# Patient Record
Sex: Female | Born: 1940 | Race: White | Hispanic: No | Smoking: Never smoker
Health system: Southern US, Community
[De-identification: ages and names within clinical notes are randomized; demographics above are authoritative.]

## PROBLEM LIST (undated history)

## (undated) DIAGNOSIS — E039 Hypothyroidism, unspecified: Secondary | ICD-10-CM

## (undated) DIAGNOSIS — M329 Systemic lupus erythematosus, unspecified: Secondary | ICD-10-CM

## (undated) DIAGNOSIS — F0390 Unspecified dementia without behavioral disturbance: Secondary | ICD-10-CM

## (undated) DIAGNOSIS — I720 Aneurysm of carotid artery: Secondary | ICD-10-CM

---

## 2009-10-04 ENCOUNTER — Ambulatory Visit: Payer: Self-pay | Admitting: Surgery

## 2009-11-08 ENCOUNTER — Ambulatory Visit: Payer: Self-pay | Admitting: Surgery

## 2009-11-08 ENCOUNTER — Encounter: Admission: RE | Admit: 2009-11-08 | Discharge: 2009-11-08 | Payer: Self-pay | Admitting: Surgery

## 2011-02-14 NOTE — Procedures (Signed)
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   HISTORY:  Diabetes:  No.  Cardiac:  No.  Hypertension:  No.  Smoking:  No.  Previous Surgery:  No.  CV History:  Asymptomatic, the patient states she has known about a  pulsatile area in her left neck area for greater than 20 years.  Amaurosis Fugax No, Paresthesias No, Hemiparesis No                                       RIGHT             LEFT  Brachial systolic pressure:         142               132  Brachial Doppler waveforms:         Normal            Normal  Vertebral direction of flow:        Antegrade         Antegrade  DUPLEX VELOCITIES (cm/sec)  CCA peak systolic                   93                87  ECA peak systolic                   84                70  ICA peak systolic                   65                100  ICA end diastolic                   18                20  PLAQUE MORPHOLOGY:  PLAQUE AMOUNT:                      None              None  PLAQUE LOCATION:   IMPRESSION:  1. No evidence of stenosis noted in the bilateral internal carotid      arteries.  2. There is a 1.6 x 1.7 x 1.5 cm structure with turbulent internal      arterial flow noted superior to the right bifurcation and adjacent      to the proximal to mid right internal carotid artery.  The source      of the flow in this area is not adequately visualized.   ___________________________________________  V. Charlena Cross, MD   CH/MEDQ  D:  10/05/2009  T:  10/06/2009  Job:  [id number]

## 2011-02-14 NOTE — Assessment & Plan Note (Signed)
OFFICE VISIT   LATAUSHA, FLAMM  DOB:  Feb 13, 1941                                       11/08/2009  [id number]#:[id number]   Patient comes back today for follow-up, of having ordered a CT scan for  a left neck mass.  She comes back in for further discussion.  She did  tell me today that she has had a lupus-like diagnosis in the past.  She  has not had any symptoms since I last saw her.   DIAGNOSTIC STUDIES:  CT scan was extensively reviewed.  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   I spent an excess of 45 minutes with the patient and her son, describing  her condition as well as showing it to them on imaging studies.  We  discussed that this is an extremely rare finding.  The etiology is  somewhat unclear.  It could be related to fibromuscular disease or some  form of autoimmune disease, given her history of thyroid disorder as  well as possible lupus.  Regardless, based on the size of the aneurysms,  I think it would be prudent to proceed with resection and repair.  The  next step in this management would be to get an arteriogram to better  define the anatomy.  She has been scheduled for a bilateral internal  carotid angiogram on Tuesday, February 22nd.  Following her angiogram,  we will have a preoperative consultation to talk about the details of  the operation.     Jorge Ny, MD  Electronically Signed   VWB/MEDQ  D:  11/08/2009  T:  11/09/2009  Job:  [id number]   cc:   Clydie Braun L. Hal Hope, M.D.
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
## 2011-02-14 NOTE — Procedures (Signed)
VASCULAR LAB EXAM   INDICATION:   HISTORY:  Diabetes.  No cardiac.  No hypertension.   EXAM:  Evaluation of left neck mass.   IMPRESSION:  Questionable aneurysm over branch artery from the left  internal carotid artery and calcified mass noted posterior to the  internal carotid artery and external carotid artery at the bifurcation  area.   REASON:  Left neck mass.   ___________________________________________  V. Charlena Cross, MD   CB/MEDQ  D:  11/08/2009  T:  11/08/2009  Job:  [id number]

## 2016-05-20 DIAGNOSIS — E039 Hypothyroidism, unspecified: Secondary | ICD-10-CM | POA: Insufficient documentation

## 2016-09-27 ENCOUNTER — Emergency Department (HOSPITAL_COMMUNITY): Payer: Medicare HMO

## 2016-09-27 ENCOUNTER — Emergency Department (HOSPITAL_COMMUNITY)
Admission: EM | Admit: 2016-09-27 | Discharge: 2016-09-27 | Disposition: A | Discharge status: Home / Self Care | Payer: Medicare HMO | Attending: Emergency Medicine | Admitting: Emergency Medicine

## 2016-09-27 ENCOUNTER — Encounter (HOSPITAL_COMMUNITY): Payer: Self-pay | Admitting: Emergency Medicine

## 2016-09-27 DIAGNOSIS — Y999 Unspecified external cause status: Secondary | ICD-10-CM | POA: Insufficient documentation

## 2016-09-27 DIAGNOSIS — S0990XA Unspecified injury of head, initial encounter: Secondary | ICD-10-CM | POA: Diagnosis not present

## 2016-09-27 DIAGNOSIS — Y92002 Bathroom of unspecified non-institutional (private) residence single-family (private) house as the place of occurrence of the external cause: Secondary | ICD-10-CM | POA: Insufficient documentation

## 2016-09-27 DIAGNOSIS — Y939 Activity, unspecified: Secondary | ICD-10-CM | POA: Diagnosis not present

## 2016-09-27 DIAGNOSIS — S42291A Other displaced fracture of upper end of right humerus, initial encounter for closed fracture: Secondary | ICD-10-CM | POA: Diagnosis not present

## 2016-09-27 DIAGNOSIS — W19XXXA Unspecified fall, initial encounter: Secondary | ICD-10-CM

## 2016-09-27 DIAGNOSIS — W228XXA Striking against or struck by other objects, initial encounter: Secondary | ICD-10-CM | POA: Insufficient documentation

## 2016-09-27 DIAGNOSIS — S0511XA Contusion of eyeball and orbital tissues, right eye, initial encounter: Secondary | ICD-10-CM | POA: Diagnosis not present

## 2016-09-27 LAB — BASIC METABOLIC PANEL
ANION GAP: 9 (ref 5–15)
BUN: 15 mg/dL (ref 6–20)
CALCIUM: 9.1 mg/dL (ref 8.9–10.3)
CO2: 27 mmol/L (ref 22–32)
Chloride: 104 mmol/L (ref 101–111)
Creatinine, Ser: 0.87 mg/dL (ref 0.44–1.00)
GFR calc Af Amer: >60 mL/min (ref >60)
GFR calc non Af Amer: >60 mL/min (ref >60)
GLUCOSE: 118 mg/dL — AB (ref 65–99)
Potassium: 4.1 mmol/L (ref 3.5–5.1)
Sodium: 140 mmol/L (ref 135–145)

## 2016-09-27 LAB — CBC WITH DIFFERENTIAL/PLATELET
Basophils Absolute: 0 10*3/uL (ref 0.0–0.1)
Basophils Relative: 0 %
Eosinophils Absolute: 0.1 10*3/uL (ref 0.0–0.7)
Eosinophils Relative: 1 %
HEMATOCRIT: 40.3 % (ref 36.0–46.0)
Hemoglobin: 13.2 g/dL (ref 12.0–15.0)
LYMPHS PCT: 12 %
Lymphs Abs: 0.8 10*3/uL (ref 0.7–4.0)
MCH: 29.8 pg (ref 26.0–34.0)
MCHC: 32.8 g/dL (ref 30.0–36.0)
MCV: 91 fL (ref 78.0–100.0)
MONO ABS: 0.5 10*3/uL (ref 0.1–1.0)
MONOS PCT: 8 %
NEUTROS ABS: 5.6 10*3/uL (ref 1.7–7.7)
Neutrophils Relative %: 79 %
Platelets: 242 10*3/uL (ref 150–400)
RBC: 4.43 MIL/uL (ref 3.87–5.11)
RDW: 13.4 % (ref 11.5–15.5)
WBC: 7.1 10*3/uL (ref 4.0–10.5)

## 2016-09-27 MED ORDER — HYDROCODONE-ACETAMINOPHEN 5-325 MG PO TABS: 1.0000 | ORAL_TABLET | Freq: Four times a day (QID) | ORAL | 0 refills | Status: DC | PRN

## 2016-09-27 MED ORDER — HYDROCODONE-ACETAMINOPHEN 5-325 MG PO TABS
2.0000 | ORAL_TABLET | Freq: Once | ORAL | Status: AC
  Administered 2016-09-27: 2 via ORAL
  Filled 2016-09-27: qty 2

## 2016-09-27 NOTE — ED Provider Notes (Signed)
[facility]-EMERGENCY DEPT Provider Note   CSN: [id number] Arrival date & time: 09/27/16  [id number]     History   Chief Complaint Chief Complaint  Patient presents with  . Fall    HPI Tamara Fisher is a 75 y.o. female.  Patient presents to the ED from [facility] assisted [facility] with a chief complaint of fall.  The fall was unwitnessed.  Patient states that over the past several days, she has felt dizzy.  She states that last night she got up to use the bathroom, and did so without difficulty. She states that after she stood up to get off of the toilet, she lost her balance and fell forward striking her head and shoulder on the bath tub. She reports moderate pain to the right shoulder and forehead.  She denies any LOC.  She denies any neck pain, numbness, weakness, or tingling.  She denies any chest pain, SOB, or abdominal pain.  She reports that her shoulder pain is worsened with palpation of the distal arm and with movement.   The history is provided by the patient. No language interpreter was used.    Past Medical History:  Diagnosis Date  . Thyroid disease     There are no active problems to display for this patient.   History reviewed. No pertinent surgical history.  OB History    No data available       Home Medications    Prior to Admission medications   Not on File    Family History No family history on file.  Social History Social History  Substance Use Topics  . Smoking status: Never Smoker  . Smokeless tobacco: Never Used  . Alcohol use No     Allergies   Patient has no allergy information on record.   Review of Systems Review of Systems  All other systems reviewed and are negative.    Physical Exam Updated Vital Signs BP 154/80 (BP Location: Left Arm)   Pulse 68   Temp 98.3 F (36.8 C) (Oral)   Resp 18   Ht 5\' 6"  (1.676 m)   Wt 68 kg   SpO2 95%   BMI 24.21 kg/m   Physical Exam  Constitutional: She is oriented to person, place, and  time. She appears well-developed and well-nourished.  HENT:  Head: Normocephalic and atraumatic.  Contusion/hematoma to right upper orbit No crepitus No entrapment  Eyes: Conjunctivae and EOM are normal. Pupils are equal, round, and reactive to light.  Neck: Normal range of motion. Neck supple.  C-spine nontender to palpation, no step-off, deformity, or other abnormality  Cardiovascular: Normal rate and regular rhythm.  Exam reveals no gallop and no friction rub.   No murmur heard. Pulmonary/Chest: Effort normal and breath sounds normal. No respiratory distress. She has no wheezes. She has no rales. She exhibits no tenderness.  Abdominal: Soft. Bowel sounds are normal. She exhibits no distension and no mass. There is no tenderness. There is no rebound and no guarding.  Musculoskeletal: Normal range of motion. She exhibits no edema or tenderness.  Right shoulder tender to palpation ROM and strength limited by pain  Neurological: She is alert and oriented to person, place, and time.  Skin: Skin is warm and dry.  Psychiatric: She has a normal mood and affect. Her behavior is normal. Judgment and thought content normal.  Nursing note and vitals reviewed.    ED Treatments / Results  Labs (all labs ordered are listed, but only abnormal results are displayed)  Labs Reviewed  BASIC METABOLIC PANEL - Abnormal; Notable for the following:       Result Value   Glucose, Bld 118 (*)    All other components within normal limits  CBC WITH DIFFERENTIAL/PLATELET    EKG  EKG Interpretation None       Radiology Dg Shoulder Right  Result Date: 09/27/2016 CLINICAL DATA:  Fall. EXAM: RIGHT SHOULDER - 2+ VIEW COMPARISON:  No recent prior. FINDINGS: Acromioclavicular and glenohumeral degenerative change. Subtle minimally displaced subcapital right humeral fracture appears to be present. No other focal abnormality identified. IMPRESSION: Subtle subcapital minimally displaced right humeral fracture  appears to be present. Electronically Signed   By: Maisie Fus  Register   On: 09/27/2016 06:47   Dg Wrist Complete Right  Result Date: 09/27/2016 CLINICAL DATA:  Fall. EXAM: RIGHT WRIST - COMPLETE 3+ VIEW COMPARISON:  No recent prior. FINDINGS: Diffuse osteopenia and degenerative change. No definite fracture identified. No evidence of dislocation. IMPRESSION: Diffuse osteopenia degenerative change. No definite fracture identified. Electronically Signed   By: Maisie Fus  Register   On: 09/27/2016 06:50   Ct Head Wo Contrast  Result Date: 09/27/2016 CLINICAL DATA:  Unwitnessed fall from toilet. No loss of consciousness. Hematoma about right eye. EXAM: CT HEAD WITHOUT CONTRAST CT CERVICAL SPINE WITHOUT CONTRAST TECHNIQUE: Multidetector CT imaging of the head and cervical spine was performed following the standard protocol without intravenous contrast. Multiplanar CT image reconstructions of the cervical spine were also generated. COMPARISON:  Neck CTA 11/08/2009 FINDINGS: CT HEAD FINDINGS Brain: No evidence of acute infarction, hemorrhage, hydrocephalus, extra-axial collection or mass lesion/mass effect. Mild chronic small vessel ischemia, normal for age. Vascular: Atherosclerosis of skullbase vasculature without hyperdense vessel or abnormal calcification. Skull: Right frontal scalp hematoma. No skull fracture. No focal lesion. Sinuses/Orbits: Paranasal sinuses and mastoid air cells are clear. The visualized orbits are unremarkable. Other: None. CT CERVICAL SPINE FINDINGS Alignment: Normal. Skull base and vertebrae: No acute fracture. No primary bone lesion or focal pathologic process. Soft tissues and spinal canal: No prevertebral fluid or swelling. No visible canal hematoma. Disc levels: Disc space narrowing and endplate spurring is most prominent at C5-C6. Upper chest: No acute abnormality. Other: Partially calcified right internal carotid artery aneurysm is unchanged. Calcifications previously seen about a left  internal carotid artery aneurysm are not visualized, likely surgically resected. IMPRESSION: 1. Right supraorbital scalp hematoma. No fracture or acute intracranial abnormality. 2. No acute fracture or subluxation of the cervical spine. Electronically Signed   By: Rubye Oaks M.D.   On: 09/27/2016 06:46   Ct Cervical Spine Wo Contrast  Result Date: 09/27/2016 CLINICAL DATA:  Unwitnessed fall from toilet. No loss of consciousness. Hematoma about right eye. EXAM: CT HEAD WITHOUT CONTRAST CT CERVICAL SPINE WITHOUT CONTRAST TECHNIQUE: Multidetector CT imaging of the head and cervical spine was performed following the standard protocol without intravenous contrast. Multiplanar CT image reconstructions of the cervical spine were also generated. COMPARISON:  Neck CTA 11/08/2009 FINDINGS: CT HEAD FINDINGS Brain: No evidence of acute infarction, hemorrhage, hydrocephalus, extra-axial collection or mass lesion/mass effect. Mild chronic small vessel ischemia, normal for age. Vascular: Atherosclerosis of skullbase vasculature without hyperdense vessel or abnormal calcification. Skull: Right frontal scalp hematoma. No skull fracture. No focal lesion. Sinuses/Orbits: Paranasal sinuses and mastoid air cells are clear. The visualized orbits are unremarkable. Other: None. CT CERVICAL SPINE FINDINGS Alignment: Normal. Skull base and vertebrae: No acute fracture. No primary bone lesion or focal pathologic process. Soft tissues and spinal canal: No prevertebral fluid  or swelling. No visible canal hematoma. Disc levels: Disc space narrowing and endplate spurring is most prominent at C5-C6. Upper chest: No acute abnormality. Other: Partially calcified right internal carotid artery aneurysm is unchanged. Calcifications previously seen about a left internal carotid artery aneurysm are not visualized, likely surgically resected. IMPRESSION: 1. Right supraorbital scalp hematoma. No fracture or acute intracranial abnormality. 2. No  acute fracture or subluxation of the cervical spine. Electronically Signed   By: Rubye OaksMelanie  Ehinger M.D.   On: 09/27/2016 06:46    Procedures Procedures (including critical care time)  Medications Ordered in ED Medications - No data to display   Initial Impression / Assessment and Plan / ED Course  I have reviewed the triage vital signs and the nursing notes.  Pertinent labs & imaging results that were available during my care of the patient were reviewed by me and considered in my medical decision making (see chart for details).  Clinical Course     Patient with fall from toilet.  Seems to have been mechanical (fell forward immediately after standing up), could be orthostatic.  Will get imaging.  Will check CBC to rule out anemia and BMP/EKG to look for any rhythm or electrolyte abnormalities.  Patient is overall well appearing.  Imaging is as above.  Right proximal mildly displaced humerus fracture.  Neurovascularly intact.  Will treat with sling immobilization.  Recommend orthopedic follow-up.  Fall seems to have been mechanical.  No LOC.  Patient stays at assisted living.  Will DC back to assisted living center.  Final Clinical Impressions(s) / ED Diagnoses   Final diagnoses:  Fall, initial encounter  Other closed nondisplaced fracture of proximal end of right humerus, initial encounter    New Prescriptions New Prescriptions   HYDROCODONE-ACETAMINOPHEN (NORCO/VICODIN) 5-325 MG TABLET    Take 1 tablet by mouth every 6 (six) hours as needed.     Roxy Horsemanobert Sarahanne Novakowski, PA-C 09/27/16 [phone]    Lavera Guiseana Duo Liu, MD 09/27/16 1754

## 2016-09-27 NOTE — Progress Notes (Signed)
Orthopedic Tech Progress Note Patient Details:  Tamara Fisher Apr 30, 1941 [id number]  Ortho Devices Type of Ortho Device: Sling immobilizer Ortho Device/Splint Location: rue Ortho Device/Splint Interventions: Application   Juston Goheen 09/27/2016, 8:15 AM

## 2016-09-27 NOTE — ED Triage Notes (Signed)
Per EMS pt from [facility] unwitnessed fall off toilet, pt became dizzy and fell forward hit R arm and R forehead, denies LOC, pt been having dizzy spells recently. Hematoma to R eye, no blood thinners.

## 2016-09-27 NOTE — Discharge Instructions (Signed)
Please keep your arm in the splint.  Please follow-up with the orthopedic doctor listed below.

## 2017-04-05 DIAGNOSIS — I509 Heart failure, unspecified: Secondary | ICD-10-CM | POA: Insufficient documentation

## 2017-08-21 ENCOUNTER — Observation Stay (HOSPITAL_COMMUNITY)
Admission: EM | Admit: 2017-08-21 | Discharge: 2017-08-22 | Payer: Medicare HMO | Attending: Internal Medicine | Admitting: Internal Medicine

## 2017-08-21 ENCOUNTER — Emergency Department (HOSPITAL_COMMUNITY): Payer: Medicare HMO

## 2017-08-21 ENCOUNTER — Encounter (HOSPITAL_COMMUNITY): Payer: Self-pay

## 2017-08-21 ENCOUNTER — Other Ambulatory Visit: Payer: Self-pay

## 2017-08-21 DIAGNOSIS — W19XXXA Unspecified fall, initial encounter: Secondary | ICD-10-CM | POA: Diagnosis present

## 2017-08-21 DIAGNOSIS — F0390 Unspecified dementia without behavioral disturbance: Secondary | ICD-10-CM | POA: Diagnosis not present

## 2017-08-21 DIAGNOSIS — I6523 Occlusion and stenosis of bilateral carotid arteries: Secondary | ICD-10-CM | POA: Diagnosis not present

## 2017-08-21 DIAGNOSIS — J9811 Atelectasis: Secondary | ICD-10-CM

## 2017-08-21 DIAGNOSIS — M4854XA Collapsed vertebra, not elsewhere classified, thoracic region, initial encounter for fracture: Secondary | ICD-10-CM | POA: Insufficient documentation

## 2017-08-21 DIAGNOSIS — I444 Left anterior fascicular block: Secondary | ICD-10-CM | POA: Diagnosis not present

## 2017-08-21 DIAGNOSIS — M4050 Lordosis, unspecified, site unspecified: Secondary | ICD-10-CM | POA: Insufficient documentation

## 2017-08-21 DIAGNOSIS — Z79899 Other long term (current) drug therapy: Secondary | ICD-10-CM | POA: Insufficient documentation

## 2017-08-21 DIAGNOSIS — M25461 Effusion, right knee: Secondary | ICD-10-CM | POA: Insufficient documentation

## 2017-08-21 DIAGNOSIS — I700 Atherosclerosis of aorta: Secondary | ICD-10-CM | POA: Diagnosis not present

## 2017-08-21 DIAGNOSIS — I4581 Long QT syndrome: Secondary | ICD-10-CM | POA: Diagnosis not present

## 2017-08-21 DIAGNOSIS — Y92129 Unspecified place in nursing home as the place of occurrence of the external cause: Secondary | ICD-10-CM | POA: Insufficient documentation

## 2017-08-21 DIAGNOSIS — M4040 Postural lordosis, site unspecified: Secondary | ICD-10-CM | POA: Insufficient documentation

## 2017-08-21 DIAGNOSIS — M50322 Other cervical disc degeneration at C5-C6 level: Secondary | ICD-10-CM | POA: Insufficient documentation

## 2017-08-21 DIAGNOSIS — R0902 Hypoxemia: Secondary | ICD-10-CM

## 2017-08-21 DIAGNOSIS — J9601 Acute respiratory failure with hypoxia: Secondary | ICD-10-CM | POA: Diagnosis not present

## 2017-08-21 DIAGNOSIS — M5136 Other intervertebral disc degeneration, lumbar region: Secondary | ICD-10-CM | POA: Insufficient documentation

## 2017-08-21 DIAGNOSIS — E039 Hypothyroidism, unspecified: Secondary | ICD-10-CM | POA: Diagnosis not present

## 2017-08-21 DIAGNOSIS — E8809 Other disorders of plasma-protein metabolism, not elsewhere classified: Secondary | ICD-10-CM | POA: Diagnosis present

## 2017-08-21 DIAGNOSIS — I779 Disorder of arteries and arterioles, unspecified: Secondary | ICD-10-CM

## 2017-08-21 DIAGNOSIS — I739 Peripheral vascular disease, unspecified: Secondary | ICD-10-CM

## 2017-08-21 DIAGNOSIS — N3000 Acute cystitis without hematuria: Secondary | ICD-10-CM | POA: Diagnosis not present

## 2017-08-21 HISTORY — DX: Unspecified dementia, unspecified severity, without behavioral disturbance, psychotic disturbance, mood disturbance, and anxiety: F03.90

## 2017-08-21 HISTORY — DX: Hypothyroidism, unspecified: E03.9

## 2017-08-21 NOTE — ED Provider Notes (Signed)
[facility]-EMERGENCY DEPT Provider Note   CSN: [id number] Arrival date & time: 08/21/17  2129     History   Chief Complaint Chief Complaint  Patient presents with  . Fall    HPI Tamara Fisher is a 76 y.o. female with a history of dementia who presents to the emergency department from memory care at [facility] after an unwitnessed fall earlier tonight.  Staff reports they heard a loud crash and immediately responded.  When they arrived in the patient's room, they found the patient laying in the bathtub. Staff reports no loss of consciousness when they arrived by her side, within several seconds.  In the emergency department, she  has no medical complaints.  The patient's daughter-in-law reports that the patient has gained approximately 25 pounds in the last 4 months.  She reports that she was scheduled for an echo cardiogram and a stress test with cardiology, but both of the appointment got rescheduled.   Level 5 caveat secondary to dementia.  The history is provided by the patient. No language interpreter was used.    Past Medical History:  Diagnosis Date  . Thyroid disease     There are no active problems to display for this patient.   History reviewed. No pertinent surgical history.  OB History    No data available       Home Medications    Prior to Admission medications   Medication Sig Start Date End Date Taking? Authorizing Provider  acetaminophen (TYLENOL) 500 MG tablet Take 500 mg by mouth 2 (two) times daily. MAY ALSO TAKE AN EXTRA TABLET DAILY FOR BREAKTHROUGH PAIN   Yes [provider]  ASPERCREME W/LIDOCAINE EX Apply 1 application topically 2 (two) times daily. Apply to bilateral knees   Yes [provider]  cholecalciferol (VITAMIN D) 1000 units tablet Take 1,000 Units by mouth daily.   Yes [provider]  clotrimazole (LOTRIMIN) 1 % cream Apply 1 application topically 2 (two) times daily as needed  (yeast). Apply to breasts   Yes [provider]  clotrimazole-betamethasone (LOTRISONE) cream Apply 1 application topically 2 (two) times daily as needed (rash).   Yes [provider]  divalproex (DEPAKOTE SPRINKLE) 125 MG capsule Take 250 mg by mouth 2 (two) times daily.   Yes [provider]  Foot Care Products (CORN CUSHIONS) PADS 1 application as directed. Apply corn pad to left second toe at all times when wearing shoes   Yes [provider]  levothyroxine (SYNTHROID, LEVOTHROID) 50 MCG tablet Take 50 mcg by mouth daily before breakfast.   Yes [provider]  LORazepam (ATIVAN) 0.5 MG tablet Take 0.5 mg by mouth 2 (two) times daily as needed for anxiety.   Yes [provider]  Melatonin 3 MG TABS Take 6 mg by mouth at bedtime.   Yes [provider]  nystatin (MYCOSTATIN/NYSTOP) powder Apply 1 g topically 3 (three) times daily as needed (rash).   Yes [provider]  OLANZapine (ZYPREXA) 5 MG tablet Take 5 mg by mouth 2 (two) times daily.   Yes [provider]  ondansetron (ZOFRAN) 4 MG tablet Take 4 mg by mouth every 4 (four) hours as needed for nausea or vomiting.   Yes [provider]  sertraline (ZOLOFT) 50 MG tablet Take 75 mg by mouth daily.   Yes [provider]  Skin Protectants, Misc. (EUCERIN) cream Apply 1 application topically daily. Apply to both feet   Yes [provider]  Family History History reviewed. No pertinent family history.  Social History Social History   Tobacco Use  . Smoking status: Never Smoker  . Smokeless tobacco: Never Used  Substance Use Topics  . Alcohol use: No  . Drug use: No     Allergies   Patient has no known allergies.   Review of Systems Review of Systems  Unable to perform ROS: Dementia  Constitutional: Negative for activity change.  Respiratory: Negative for shortness of breath.   Cardiovascular: Negative for chest pain.    Gastrointestinal: Negative for abdominal pain.  Genitourinary: Negative for dysuria and frequency.  Musculoskeletal: Negative for back pain.  Skin: Negative for rash and wound.  Neurological: Negative for dizziness.   Physical Exam Updated Vital Signs BP 119/66   Pulse 79   Temp 98.2 F (36.8 C) (Oral)   Resp 13   SpO2 92%   Physical Exam  Constitutional: No distress.  Elderly female who is pleasantly demented.  HENT:  Head: Normocephalic and atraumatic. Head is without raccoon's eyes.  No scalp hematoma.  Eyes: Conjunctivae are normal.  Neck: Neck supple.  Cardiovascular: Normal rate and regular rhythm. Exam reveals no gallop and no friction rub.  No murmur heard. Pulmonary/Chest: Effort normal and breath sounds normal. No stridor. No respiratory distress. She has no wheezes. She has no rales. She exhibits no tenderness.  Abdominal: Soft. Bowel sounds are normal. She exhibits no distension and no mass. There is no tenderness. There is no rebound and no guarding. A hernia is present.  Protuberant abdomen.  Likely periumbilical hernia noted on exam.  Musculoskeletal: Normal range of motion. She exhibits tenderness. She exhibits no edema or deformity.  No tenderness to palpation of the cervical, thoracic, or lumbar spine or surrounding paraspinal muscles.  Mild tenderness to palpation of the right knee.  No overlying erythema, ecchymosis, edema, or warmth.  Mild tenderness to palpation over the left shoulder; the patient reports a previous left humerus fracture from several years ago.   The remainder of the patient's bilateral joints, upper and lower extremities are nontender to palpation.  Neurological: She is alert.  Cranial nerves 2-12 grossly intact. Finger-to-nose is normal. 5/5 motor strength of the bilateral upper and lower extremities. Moves all four extremities. NVI.  Ambulatory with a walker.   Skin: Skin is warm. Capillary refill takes less than 2 seconds. No rash  noted.  No superficial abrasions, skin tears, or ecchymosis noted on exam.  Psychiatric: Her behavior is normal.  Nursing note and vitals reviewed.    ED Treatments / Results  Labs (all labs ordered are listed, but only abnormal results are displayed) Labs Reviewed  CBC  BRAIN NATRIURETIC PEPTIDE  COMPREHENSIVE METABOLIC PANEL  D-DIMER, QUANTITATIVE (NOT AT [facility])  I-STAT TROPONIN, ED    EKG  EKG Interpretation None       Radiology Dg Chest 2 View  Result Date: 08/21/2017 CLINICAL DATA:  Unwitnessed fall. Sternal pain on examination. Patient reports no specific complaints. EXAM: CHEST  2 VIEW COMPARISON:  None. FINDINGS: Shallow inspiration with linear atelectasis in the lung bases. Normal heart size and pulmonary vascularity. No focal airspace disease or consolidation in the lungs. No blunting of costophrenic angles. No pneumothorax. Mediastinal contours appear intact. Calcification and torsion of the aorta. Degenerative changes in the spine. Sternum does not appear depressed. IMPRESSION: Shallow inspiration with linear atelectasis in the lung bases. Electronically Signed   By: Burman Nieves M.D.   On: 08/21/2017 23:42   Dg Thoracic  Spine 2 View  Result Date: 08/21/2017 CLINICAL DATA:  Unwitnessed fall. EXAM: THORACIC SPINE 2 VIEWS COMPARISON:  None. FINDINGS: Anterior compression of the T9 vertebra. No prior comparison studies are available but there are degenerative changes at the endplates, suggesting that this is an old fracture. Vertebral body heights are otherwise preserved. Normal alignment of the thoracic spine. Diffuse degenerative changes throughout with narrowed interspaces and endplate hypertrophic changes. No paraspinal soft tissue swelling. No focal bone lesion or bone destruction. IMPRESSION: Old appearing compression fracture of T9. Diffuse degenerative changes in the thoracic spine. Normal alignment. No acute displaced fractures identified. Electronically Signed    By: Burman NievesWilliam  Stevens M.D.   On: 08/21/2017 23:44   Dg Lumbar Spine Complete  Result Date: 08/21/2017 CLINICAL DATA:  Unwitnessed fall. EXAM: LUMBAR SPINE - COMPLETE 4+ VIEW COMPARISON:  None. FINDINGS: Mild endplate compression deformities at L1-2 and L2-3 levels. This is likely due to osteoporosis. Normal alignment of the lumbar spine. Degenerative changes throughout with narrowed interspaces and endplate hypertrophic changes. No focal bone lesion or bone destruction. Bone cortex appears intact. Vascular calcifications. IMPRESSION: Normal alignment of the lumbar spine. Ballooning of endplates likely representing osteoporotic changes. No acute displaced fractures are identified. Electronically Signed   By: Burman NievesWilliam  Stevens M.D.   On: 08/21/2017 23:45   Ct Head Wo Contrast  Result Date: 08/22/2017 CLINICAL DATA:  Unwitnessed fall at nursing home. EXAM: CT HEAD WITHOUT CONTRAST CT CERVICAL SPINE WITHOUT CONTRAST TECHNIQUE: Multidetector CT imaging of the head and cervical spine was performed following the standard protocol without intravenous contrast. Multiplanar CT image reconstructions of the cervical spine were also generated. COMPARISON:  09/27/2016 FINDINGS: CT HEAD FINDINGS Brain: Chronic small vessel ischemic disease. Chronic basal ganglial lacunar infarcts. No large vascular territory infarction, hemorrhage or midline shift. No hydrocephalus. No intra-axial mass nor extra-axial fluid collections. Vascular: No hyperdense vessels. Moderate atherosclerosis of the carotid siphons. Skull: Negative for fracture. Sinuses/Orbits: Mild ethmoid sinus mucosal thickening. Intact orbits and globes. Other: Clear bilateral mastoids. CT CERVICAL SPINE FINDINGS Alignment: Maintained cervical lordosis. Intact craniocervical relationship and atlantodental interval. Skull base and vertebrae: No acute fracture. No primary bone lesion or focal pathologic process. Probable left vertebral ectasia accounting for prominence  of the left vertebral foramina at multiple levels. Soft tissues and spinal canal: No prevertebral soft tissue swelling. No visible canal hematoma. Disc levels: Degenerative disc disease and endplate spurring Z6-1C5-6 and C6-7. No jumped or perched facets. No central or significant neural foraminal encroachment Upper chest: No acute abnormality at either lung apex. Minimal aortic atherosclerosis. Other: Moderate extracranial carotid arteriosclerosis. IMPRESSION: 1. Chronic small vessel ischemic disease. Chronic bilateral basal ganglial lacunar infarcts. No acute intracranial abnormality. 2. Degenerative disc disease C5-6 and C6-7. No acute cervical spine fracture or posttraumatic subluxation. Electronically Signed   By: Tollie Ethavid  Kwon M.D.   On: 08/22/2017 00:04   Ct Cervical Spine Wo Contrast  Result Date: 08/22/2017 CLINICAL DATA:  Unwitnessed fall at nursing home. EXAM: CT HEAD WITHOUT CONTRAST CT CERVICAL SPINE WITHOUT CONTRAST TECHNIQUE: Multidetector CT imaging of the head and cervical spine was performed following the standard protocol without intravenous contrast. Multiplanar CT image reconstructions of the cervical spine were also generated. COMPARISON:  09/27/2016 FINDINGS: CT HEAD FINDINGS Brain: Chronic small vessel ischemic disease. Chronic basal ganglial lacunar infarcts. No large vascular territory infarction, hemorrhage or midline shift. No hydrocephalus. No intra-axial mass nor extra-axial fluid collections. Vascular: No hyperdense vessels. Moderate atherosclerosis of the carotid siphons. Skull: Negative for fracture. Sinuses/Orbits:  Mild ethmoid sinus mucosal thickening. Intact orbits and globes. Other: Clear bilateral mastoids. CT CERVICAL SPINE FINDINGS Alignment: Maintained cervical lordosis. Intact craniocervical relationship and atlantodental interval. Skull base and vertebrae: No acute fracture. No primary bone lesion or focal pathologic process. Probable left vertebral ectasia accounting for  prominence of the left vertebral foramina at multiple levels. Soft tissues and spinal canal: No prevertebral soft tissue swelling. No visible canal hematoma. Disc levels: Degenerative disc disease and endplate spurring W1-1 and C6-7. No jumped or perched facets. No central or significant neural foraminal encroachment Upper chest: No acute abnormality at either lung apex. Minimal aortic atherosclerosis. Other: Moderate extracranial carotid arteriosclerosis. IMPRESSION: 1. Chronic small vessel ischemic disease. Chronic bilateral basal ganglial lacunar infarcts. No acute intracranial abnormality. 2. Degenerative disc disease C5-6 and C6-7. No acute cervical spine fracture or posttraumatic subluxation. Electronically Signed   By: Tollie Eth M.D.   On: 08/22/2017 00:04   Dg Knee Complete 4 Views Right  Result Date: 08/21/2017 CLINICAL DATA:  Unwitnessed fall EXAM: RIGHT KNEE - COMPLETE 4+ VIEW COMPARISON:  None. FINDINGS: There is a small suprapatellar joint effusion. Tricompartmental osteoarthritis is noted without definite fracture nor joint dislocation. Slight shallow depression of the lateral tibial plateau is likely due to osteoarthritis given bone-on-bone appearance on one of the views. IMPRESSION: 1. Tricompartmental osteoarthritis with small joint effusion. 2. No acute fracture nor joint dislocation is apparent. Electronically Signed   By: Tollie Eth M.D.   On: 08/21/2017 23:48   Dg Hips Bilat W Or Wo Pelvis 5 Views  Result Date: 08/21/2017 CLINICAL DATA:  Unwitnessed fall in bathroom. EXAM: DG HIP (WITH OR WITHOUT PELVIS) 5+V BILAT COMPARISON:  None. FINDINGS: There is no evidence of hip fracture or dislocation. Mild degenerative joint space narrowing of both hips left slightly worse than right. Mild degenerative disc disease of the included lower lumbar spine with facet arthropathy. The bony pelvis appears intact. IMPRESSION: 1. Lower lumbar degenerative disc and facet arthropathy. 2. Mild  degenerative joint space narrowing both hips. 3. No acute osseous abnormality. Electronically Signed   By: Tollie Eth M.D.   On: 08/21/2017 23:46    Procedures Procedures (including critical care time)  Medications Ordered in ED Medications - No data to display   Initial Impression / Assessment and Plan / ED Course  I have reviewed the triage vital signs and the nursing notes.  Pertinent labs & imaging results that were available during my care of the patient were reviewed by me and considered in my medical decision making (see chart for details).     76 year old female presenting from [facility] after an unwitnessed fall.  Upon arrival in the room, the patient's SaO2 is 89% on room air with good waveform on the monitor.  Titrated O2 to 2 L with SaO2 of 93-94.  CT of the head and cervical spine are unremarkable.  X-rays of the cervical, thoracic, lumbar spine and pelvis with bilateral hips, and right knee are unremarkable for acute changes.  On second evaluation, the patient was monitored for 2-3 minutes with oxygen saturation maintained at 92% on RA.  When the patient was ambulated through the department, SaO2 remained between 86-89%. The patient was placed back on 2L of O2. EKG pending. CBC, CMP, D-dimer, and BNP are pending.  The patient was discussed and evaluated with Dr. Elesa Massed, attending physician.  Consulted the hospitalist team and spoke with Dr. Robb Matar who will admit the patient for hypoxia. The patient appears reasonably  stabilized for admission considering the current resources, flow, and capabilities available in the ED at this time, and I doubt any other [facility] requiring further screening and/or treatment in the ED prior to admission.  Final Clinical Impressions(s) / ED Diagnoses   Final diagnoses:  Hypoxia  Fall, initial encounter    ED Discharge Orders    None       Barkley Boards, PA-C 08/22/17 0207    Ward, Layla Maw, DO 08/22/17 [phone]

## 2017-08-21 NOTE — ED Notes (Signed)
Bed: WA07 Expected date:  Expected time:  Means of arrival:  Comments: Fall 

## 2017-08-21 NOTE — ED Triage Notes (Addendum)
Pt BIB [facility] from Memory Care at [facility] after an unwitnessed fall. Staff states that they heard a loud crash and found pt laying in bath tub. No visible injuries and pt has no complaints. No LOC witnessed and no blood thinners to note. She has a hx of dementia and is sleepier than her baseline per facility, but she did take her melatonin PTA. Pt daughter in law at bedside.

## 2017-08-22 ENCOUNTER — Encounter (HOSPITAL_COMMUNITY): Payer: Self-pay | Admitting: Internal Medicine

## 2017-08-22 DIAGNOSIS — R0902 Hypoxemia: Secondary | ICD-10-CM | POA: Diagnosis not present

## 2017-08-22 DIAGNOSIS — J9601 Acute respiratory failure with hypoxia: Secondary | ICD-10-CM | POA: Diagnosis present

## 2017-08-22 DIAGNOSIS — N390 Urinary tract infection, site not specified: Secondary | ICD-10-CM

## 2017-08-22 DIAGNOSIS — J9811 Atelectasis: Secondary | ICD-10-CM

## 2017-08-22 DIAGNOSIS — E8809 Other disorders of plasma-protein metabolism, not elsewhere classified: Secondary | ICD-10-CM | POA: Diagnosis present

## 2017-08-22 DIAGNOSIS — N3000 Acute cystitis without hematuria: Secondary | ICD-10-CM

## 2017-08-22 DIAGNOSIS — I779 Disorder of arteries and arterioles, unspecified: Secondary | ICD-10-CM

## 2017-08-22 DIAGNOSIS — I739 Peripheral vascular disease, unspecified: Secondary | ICD-10-CM

## 2017-08-22 DIAGNOSIS — F0391 Unspecified dementia with behavioral disturbance: Secondary | ICD-10-CM

## 2017-08-22 DIAGNOSIS — W19XXXA Unspecified fall, initial encounter: Secondary | ICD-10-CM | POA: Diagnosis present

## 2017-08-22 DIAGNOSIS — E039 Hypothyroidism, unspecified: Secondary | ICD-10-CM | POA: Diagnosis present

## 2017-08-22 DIAGNOSIS — F0390 Unspecified dementia without behavioral disturbance: Secondary | ICD-10-CM | POA: Diagnosis present

## 2017-08-22 LAB — COMPREHENSIVE METABOLIC PANEL
ALBUMIN: 3.3 g/dL — AB (ref 3.5–5.0)
ALK PHOS: 76 U/L (ref 38–126)
ALT: 26 U/L (ref 14–54)
ANION GAP: 9 (ref 5–15)
AST: 30 U/L (ref 15–41)
BILIRUBIN TOTAL: 0.5 mg/dL (ref 0.3–1.2)
BUN: 17 mg/dL (ref 6–20)
CALCIUM: 8.5 mg/dL — AB (ref 8.9–10.3)
CO2: 24 mmol/L (ref 22–32)
CREATININE: 0.85 mg/dL (ref 0.44–1.00)
Chloride: 104 mmol/L (ref 101–111)
GFR calc Af Amer: >60 mL/min (ref >60)
GFR calc non Af Amer: >60 mL/min (ref >60)
GLUCOSE: 119 mg/dL — AB (ref 65–99)
Potassium: 4 mmol/L (ref 3.5–5.1)
SODIUM: 137 mmol/L (ref 135–145)
TOTAL PROTEIN: 6.2 g/dL — AB (ref 6.5–8.1)

## 2017-08-22 LAB — URINALYSIS, ROUTINE W REFLEX MICROSCOPIC
Bilirubin Urine: NEGATIVE
GLUCOSE, UA: NEGATIVE mg/dL
HGB URINE DIPSTICK: NEGATIVE
Ketones, ur: NEGATIVE mg/dL
NITRITE: NEGATIVE
PH: 5 (ref 5.0–8.0)
PROTEIN: NEGATIVE mg/dL
Specific Gravity, Urine: 1.013 (ref 1.005–1.030)

## 2017-08-22 LAB — CBC
HEMATOCRIT: 39.1 % (ref 36.0–46.0)
HEMOGLOBIN: 12.9 g/dL (ref 12.0–15.0)
MCH: 30.7 pg (ref 26.0–34.0)
MCHC: 33 g/dL (ref 30.0–36.0)
MCV: 93.1 fL (ref 78.0–100.0)
Platelets: 181 10*3/uL (ref 150–400)
RBC: 4.2 MIL/uL (ref 3.87–5.11)
RDW: 13.8 % (ref 11.5–15.5)
WBC: 5.7 10*3/uL (ref 4.0–10.5)

## 2017-08-22 LAB — I-STAT TROPONIN, ED: Troponin i, poc: 0 ng/mL (ref 0.00–0.08)

## 2017-08-22 LAB — BRAIN NATRIURETIC PEPTIDE: B NATRIURETIC PEPTIDE 5: 18.3 pg/mL (ref 0.0–100.0)

## 2017-08-22 LAB — MRSA PCR SCREENING: MRSA by PCR: NEGATIVE

## 2017-08-22 LAB — TSH: TSH: 4.7 u[IU]/mL — ABNORMAL HIGH (ref 0.350–4.500)

## 2017-08-22 LAB — D-DIMER, QUANTITATIVE: D-Dimer, Quant: 0.44 ug/mL-FEU (ref 0.00–0.50)

## 2017-08-22 MED ORDER — LEVOTHYROXINE SODIUM 75 MCG PO TABS
75.0000 ug | ORAL_TABLET | Freq: Every day | ORAL | Status: DC
  Administered 2017-08-22: 75 ug via ORAL
  Filled 2017-08-22: qty 1

## 2017-08-22 MED ORDER — VITAMIN D3 25 MCG (1000 UNIT) PO TABS
1000.0000 [IU] | ORAL_TABLET | Freq: Every day | ORAL | Status: DC
  Administered 2017-08-22: 1000 [IU] via ORAL
  Filled 2017-08-22: qty 1

## 2017-08-22 MED ORDER — LORAZEPAM 0.5 MG PO TABS: 0.5000 mg | ORAL_TABLET | Freq: Two times a day (BID) | ORAL | Status: DC | PRN

## 2017-08-22 MED ORDER — CLOTRIMAZOLE 1 % EX CREA
1.0000 "application " | TOPICAL_CREAM | Freq: Two times a day (BID) | CUTANEOUS | Status: DC | PRN
  Filled 2017-08-22: qty 15

## 2017-08-22 MED ORDER — DEXTROSE 5 % IV SOLN
1.0000 g | INTRAVENOUS | Status: DC
  Administered 2017-08-22: 1 g via INTRAVENOUS
  Filled 2017-08-22: qty 10

## 2017-08-22 MED ORDER — HYDROCERIN EX CREA
1.0000 "application " | TOPICAL_CREAM | Freq: Every day | CUTANEOUS | Status: DC
  Filled 2017-08-22: qty 113

## 2017-08-22 MED ORDER — ENOXAPARIN SODIUM 40 MG/0.4ML ~~LOC~~ SOLN
40.0000 mg | SUBCUTANEOUS | Status: DC
  Administered 2017-08-22: 40 mg via SUBCUTANEOUS
  Filled 2017-08-22: qty 0.4

## 2017-08-22 MED ORDER — CEPHALEXIN 500 MG PO CAPS: 500.0000 mg | ORAL_CAPSULE | Freq: Two times a day (BID) | ORAL | 0 refills | Status: AC

## 2017-08-22 MED ORDER — OLANZAPINE 5 MG PO TABS
5.0000 mg | ORAL_TABLET | Freq: Two times a day (BID) | ORAL | Status: DC
  Administered 2017-08-22: 5 mg via ORAL
  Filled 2017-08-22: qty 1

## 2017-08-22 MED ORDER — LEVOTHYROXINE SODIUM 50 MCG PO TABS: 50.0000 ug | ORAL_TABLET | Freq: Every day | ORAL | Status: DC

## 2017-08-22 MED ORDER — ONDANSETRON HCL 4 MG PO TABS: 4.0000 mg | ORAL_TABLET | ORAL | Status: DC | PRN

## 2017-08-22 MED ORDER — SERTRALINE HCL 50 MG PO TABS
75.0000 mg | ORAL_TABLET | Freq: Every day | ORAL | Status: DC
  Administered 2017-08-22: 75 mg via ORAL
  Filled 2017-08-22 (×2): qty 1

## 2017-08-22 MED ORDER — ALBUTEROL SULFATE (2.5 MG/3ML) 0.083% IN NEBU: 2.5000 mg | INHALATION_SOLUTION | RESPIRATORY_TRACT | Status: DC | PRN

## 2017-08-22 MED ORDER — DIVALPROEX SODIUM 125 MG PO CSDR
250.0000 mg | DELAYED_RELEASE_CAPSULE | Freq: Two times a day (BID) | ORAL | Status: DC
  Administered 2017-08-22: 250 mg via ORAL
  Filled 2017-08-22 (×3): qty 2

## 2017-08-22 MED ORDER — LEVOTHYROXINE SODIUM 75 MCG PO TABS
75.0000 ug | ORAL_TABLET | Freq: Every day | ORAL | 0 refills | Status: AC
Start: 2017-08-23 — End: ?

## 2017-08-22 MED ORDER — MELATONIN 3 MG PO TABS: 6.0000 mg | ORAL_TABLET | Freq: Every day | ORAL | Status: DC

## 2017-08-22 MED ORDER — ACETAMINOPHEN 500 MG PO TABS
500.0000 mg | ORAL_TABLET | Freq: Two times a day (BID) | ORAL | Status: DC
  Administered 2017-08-22: 500 mg via ORAL
  Filled 2017-08-22: qty 1

## 2017-08-22 NOTE — Care Management Note (Signed)
Case Management Note  Patient Details  Name: Tamara Fisher MRN: [id number] Date of Birth: Feb 20, 1941  Subjective/Objective:  76 y/o f admitted w/Hypoxia. From [facility]. Noted on 02-will monitor if needed @ ALF.PT cons await recc.                  Action/Plan:d/c plan ALF   Expected Discharge Date:                  Expected Discharge Plan:  Assisted Living / Rest Home  In-House Referral:  Clinical Social Work  Discharge planning Services  CM Consult  Post Acute Care Choice:    Choice offered to:     DME Arranged:    DME Agency:     HH Arranged:    HH Agency:     Status of Service:  In process, will continue to follow  If discussed at Long Length of Stay Meetings, dates discussed:    Additional Comments:  Lanier ClamMahabir, Mahima Hottle, RN 08/22/2017, 11:19 AM

## 2017-08-22 NOTE — Discharge Summary (Addendum)
Physician Discharge Summary  Tamara Fisher JXB:[id number] DOB: 27-Jan-1941 DOA: 08/21/2017  PCP: Patient, No Pcp Per  Admit date: 08/21/2017 Discharge date: 08/22/2017  Admitted From: [facility] Disposition:  [facility]  Recommendations for Outpatient Follow-up:  1.  Repeat thyroid function test in 1 month 2.  PCP assessment on Friday to check vital signs including pulse ox 3.  Encourage patient to be out of bed, sitting in chair 4.  Encourage use of incentive spirometer 5.  Continue keflex through 11/27, then stop 6.  F/u with Cardiology for ECHO, stress test, and carotid duplex    Home Health:  PT/OT at ALF  Equipment/Devices:  Incentive spirometry  Discharge Condition:  Stable, improved CODE STATUS:  Full code  Diet recommendation:   regular  Brief/Interim Summary:  Tamara Fisher is a 76 y.o. female with history of hypothyroidism and dementia who was brought to the emergency department from [facility] unit after having an unwitnessed fall the night prior to admission.  The facility staff mentions hearing a loud crash noise, went to her room and found her laying in the bathtub.  Per facility staff, there was no LOC or other complaints.  In the ER, she was found to have a UTI and was started on ceftriaxone.  Her O2 sat fell to the mid 80s while ambulating with the staff and she was started on supplemental oxygen.  Chest radiograph showed linear atelectasis.  She was given an incentive spirometer and encouraged to be out of bed.  After a few hours, she was maintaining O2 saturations in the mid-90s at rest and with exertion.    Other imaging:  CT head/C-spine did not show any acute intracranial pathology.  However it showed chronic small vessel disease, bilateral basal ganglia lacunar infarcts and extensive extracranial atherosclerosis of the carotid vessels among other findings.  Right knee, bilateral hip, lumbar spine and thoracic  spine showed chronic degenerative joint disease changes, but no acute abnormality.  Please see images and radiology report for full details.  Discharge Diagnoses:  Principal Problem:   Acute respiratory failure with hypoxia (HCC) Active Problems:   Fall   Hypothyroidism   Dementia   Hypoalbuminemia   Acute cystitis   Atelectasis   Carotid artery disease (HCC)  Acute hypoxic respiratory failure secondary to atelectasis related to a fall and possibly to medication side effect after taking her nightly medications -Oxygen levels quickly improved with incentive spirometry and increase activity  Fall Fall precautions. PT recommended home health PT  Acute cystitis/UTI present at the time of admission -Unable to add on urine culture -Given ceftriaxone during hospitalization and transition to Keflex to complete her course of antibiotics  Hypothyroidism, TSH 4.7 -increased levothyroxine to daily -Repeat thyroid function tests in 1 month    Dementia, from memory care unit.  No changes were made to her medications Continue Depakote 250 mg p.o. twice daily. Continue lorazepam 0.5 mg p.o. twice daily as needed for anxiety. Continue Zyprexa 5 mg p.o. twice daily. Continue Zoloft 75 mg at p.o. daily.    Hypoalbuminemia Minimally low. Nutritional requirements should be followed as an outpatient.  Vascular disease/possible CAD:  Family reports patient is scheduled for a cardiology appt for ECHO and possible stress test.   -  Would include carotid duplex if not done recently also given carotid atherosclerosis seen on CT   Discharge Instructions  Discharge Instructions    Call MD for:  difficulty breathing, headache  or visual disturbances   Complete by:  As directed    Call MD for:  extreme fatigue   Complete by:  As directed    Call MD for:  hives   Complete by:  As directed    Call MD for:  persistant dizziness or light-headedness   Complete by:  As directed    Call MD  for:  persistant nausea and vomiting   Complete by:  As directed    Call MD for:  severe uncontrolled pain   Complete by:  As directed    Call MD for:  temperature >100.4   Complete by:  As directed    Diet general   Complete by:  As directed    Increase activity slowly   Complete by:  As directed        Medication List    TAKE these medications   acetaminophen 500 MG tablet Commonly known as:  TYLENOL Take 500 mg by mouth 2 (two) times daily. MAY ALSO TAKE AN EXTRA TABLET DAILY FOR BREAKTHROUGH PAIN   ASPERCREME W/LIDOCAINE EX Apply 1 application topically 2 (two) times daily. Apply to bilateral knees   cephALEXin 500 MG capsule Commonly known as:  KEFLEX Take 1 capsule (500 mg total) by mouth 2 (two) times daily for 6 days.   cholecalciferol 1000 units tablet Commonly known as:  VITAMIN D Take 1,000 Units by mouth daily.   clotrimazole 1 % cream Commonly known as:  LOTRIMIN Apply 1 application topically 2 (two) times daily as needed (yeast). Apply to breasts   clotrimazole-betamethasone cream Commonly known as:  LOTRISONE Apply 1 application topically 2 (two) times daily as needed (rash).   Corn Cushions Pads 1 application as directed. Apply corn pad to left second toe at all times when wearing shoes   divalproex 125 MG capsule Commonly known as:  DEPAKOTE SPRINKLE Take 250 mg by mouth 2 (two) times daily.   eucerin cream Apply 1 application topically daily. Apply to both feet   levothyroxine 75 MCG tablet Commonly known as:  SYNTHROID, LEVOTHROID Take 1 tablet (75 mcg total) by mouth daily before breakfast. Start taking on:  08/23/2017 What changed:    medication strength  how much to take   LORazepam 0.5 MG tablet Commonly known as:  ATIVAN Take 0.5 mg by mouth 2 (two) times daily as needed for anxiety.   Melatonin 3 MG Tabs Take 6 mg by mouth at bedtime.   nystatin powder Commonly known as:  MYCOSTATIN/NYSTOP Apply 1 g topically 3 (three) times  daily as needed (rash).   OLANZapine 5 MG tablet Commonly known as:  ZYPREXA Take 5 mg by mouth 2 (two) times daily.   ondansetron 4 MG tablet Commonly known as:  ZOFRAN Take 4 mg by mouth every 4 (four) hours as needed for nausea or vomiting.   sertraline 50 MG tablet Commonly known as:  ZOLOFT Take 75 mg by mouth daily.       No Known Allergies  Consultations: none    Procedures/Studies: Dg Chest 2 View  Result Date: 08/21/2017 CLINICAL DATA:  Unwitnessed fall. Sternal pain on examination. Patient reports no specific complaints. EXAM: CHEST  2 VIEW COMPARISON:  None. FINDINGS: Shallow inspiration with linear atelectasis in the lung bases. Normal heart size and pulmonary vascularity. No focal airspace disease or consolidation in the lungs. No blunting of costophrenic angles. No pneumothorax. Mediastinal contours appear intact. Calcification and torsion of the aorta. Degenerative changes in the spine. Sternum does not  appear depressed. IMPRESSION: Shallow inspiration with linear atelectasis in the lung bases. Electronically Signed   By: Burman Nieves M.D.   On: 08/21/2017 23:42   Dg Thoracic Spine 2 View  Result Date: 08/21/2017 CLINICAL DATA:  Unwitnessed fall. EXAM: THORACIC SPINE 2 VIEWS COMPARISON:  None. FINDINGS: Anterior compression of the T9 vertebra. No prior comparison studies are available but there are degenerative changes at the endplates, suggesting that this is an old fracture. Vertebral body heights are otherwise preserved. Normal alignment of the thoracic spine. Diffuse degenerative changes throughout with narrowed interspaces and endplate hypertrophic changes. No paraspinal soft tissue swelling. No focal bone lesion or bone destruction. IMPRESSION: Old appearing compression fracture of T9. Diffuse degenerative changes in the thoracic spine. Normal alignment. No acute displaced fractures identified. Electronically Signed   By: Burman Nieves M.D.   On: 08/21/2017  23:44   Dg Lumbar Spine Complete  Result Date: 08/21/2017 CLINICAL DATA:  Unwitnessed fall. EXAM: LUMBAR SPINE - COMPLETE 4+ VIEW COMPARISON:  None. FINDINGS: Mild endplate compression deformities at L1-2 and L2-3 levels. This is likely due to osteoporosis. Normal alignment of the lumbar spine. Degenerative changes throughout with narrowed interspaces and endplate hypertrophic changes. No focal bone lesion or bone destruction. Bone cortex appears intact. Vascular calcifications. IMPRESSION: Normal alignment of the lumbar spine. Ballooning of endplates likely representing osteoporotic changes. No acute displaced fractures are identified. Electronically Signed   By: Burman Nieves M.D.   On: 08/21/2017 23:45   Ct Head Wo Contrast  Result Date: 08/22/2017 CLINICAL DATA:  Unwitnessed fall at nursing home. EXAM: CT HEAD WITHOUT CONTRAST CT CERVICAL SPINE WITHOUT CONTRAST TECHNIQUE: Multidetector CT imaging of the head and cervical spine was performed following the standard protocol without intravenous contrast. Multiplanar CT image reconstructions of the cervical spine were also generated. COMPARISON:  09/27/2016 FINDINGS: CT HEAD FINDINGS Brain: Chronic small vessel ischemic disease. Chronic basal ganglial lacunar infarcts. No large vascular territory infarction, hemorrhage or midline shift. No hydrocephalus. No intra-axial mass nor extra-axial fluid collections. Vascular: No hyperdense vessels. Moderate atherosclerosis of the carotid siphons. Skull: Negative for fracture. Sinuses/Orbits: Mild ethmoid sinus mucosal thickening. Intact orbits and globes. Other: Clear bilateral mastoids. CT CERVICAL SPINE FINDINGS Alignment: Maintained cervical lordosis. Intact craniocervical relationship and atlantodental interval. Skull base and vertebrae: No acute fracture. No primary bone lesion or focal pathologic process. Probable left vertebral ectasia accounting for prominence of the left vertebral foramina at multiple  levels. Soft tissues and spinal canal: No prevertebral soft tissue swelling. No visible canal hematoma. Disc levels: Degenerative disc disease and endplate spurring Z6-1 and C6-7. No jumped or perched facets. No central or significant neural foraminal encroachment Upper chest: No acute abnormality at either lung apex. Minimal aortic atherosclerosis. Other: Moderate extracranial carotid arteriosclerosis. IMPRESSION: 1. Chronic small vessel ischemic disease. Chronic bilateral basal ganglial lacunar infarcts. No acute intracranial abnormality. 2. Degenerative disc disease C5-6 and C6-7. No acute cervical spine fracture or posttraumatic subluxation. Electronically Signed   By: Tollie Eth M.D.   On: 08/22/2017 00:04   Ct Cervical Spine Wo Contrast  Result Date: 08/22/2017 CLINICAL DATA:  Unwitnessed fall at nursing home. EXAM: CT HEAD WITHOUT CONTRAST CT CERVICAL SPINE WITHOUT CONTRAST TECHNIQUE: Multidetector CT imaging of the head and cervical spine was performed following the standard protocol without intravenous contrast. Multiplanar CT image reconstructions of the cervical spine were also generated. COMPARISON:  09/27/2016 FINDINGS: CT HEAD FINDINGS Brain: Chronic small vessel ischemic disease. Chronic basal ganglial lacunar infarcts. No large vascular  territory infarction, hemorrhage or midline shift. No hydrocephalus. No intra-axial mass nor extra-axial fluid collections. Vascular: No hyperdense vessels. Moderate atherosclerosis of the carotid siphons. Skull: Negative for fracture. Sinuses/Orbits: Mild ethmoid sinus mucosal thickening. Intact orbits and globes. Other: Clear bilateral mastoids. CT CERVICAL SPINE FINDINGS Alignment: Maintained cervical lordosis. Intact craniocervical relationship and atlantodental interval. Skull base and vertebrae: No acute fracture. No primary bone lesion or focal pathologic process. Probable left vertebral ectasia accounting for prominence of the left vertebral foramina at  multiple levels. Soft tissues and spinal canal: No prevertebral soft tissue swelling. No visible canal hematoma. Disc levels: Degenerative disc disease and endplate spurring Y7-8C5-6 and C6-7. No jumped or perched facets. No central or significant neural foraminal encroachment Upper chest: No acute abnormality at either lung apex. Minimal aortic atherosclerosis. Other: Moderate extracranial carotid arteriosclerosis. IMPRESSION: 1. Chronic small vessel ischemic disease. Chronic bilateral basal ganglial lacunar infarcts. No acute intracranial abnormality. 2. Degenerative disc disease C5-6 and C6-7. No acute cervical spine fracture or posttraumatic subluxation. Electronically Signed   By: Tollie Ethavid  Kwon M.D.   On: 08/22/2017 00:04   Dg Knee Complete 4 Views Right  Result Date: 08/21/2017 CLINICAL DATA:  Unwitnessed fall EXAM: RIGHT KNEE - COMPLETE 4+ VIEW COMPARISON:  None. FINDINGS: There is a small suprapatellar joint effusion. Tricompartmental osteoarthritis is noted without definite fracture nor joint dislocation. Slight shallow depression of the lateral tibial plateau is likely due to osteoarthritis given bone-on-bone appearance on one of the views. IMPRESSION: 1. Tricompartmental osteoarthritis with small joint effusion. 2. No acute fracture nor joint dislocation is apparent. Electronically Signed   By: Tollie Ethavid  Kwon M.D.   On: 08/21/2017 23:48   Dg Hips Bilat W Or Wo Pelvis 5 Views  Result Date: 08/21/2017 CLINICAL DATA:  Unwitnessed fall in bathroom. EXAM: DG HIP (WITH OR WITHOUT PELVIS) 5+V BILAT COMPARISON:  None. FINDINGS: There is no evidence of hip fracture or dislocation. Mild degenerative joint space narrowing of both hips left slightly worse than right. Mild degenerative disc disease of the included lower lumbar spine with facet arthropathy. The bony pelvis appears intact. IMPRESSION: 1. Lower lumbar degenerative disc and facet arthropathy. 2. Mild degenerative joint space narrowing both hips. 3. No  acute osseous abnormality. Electronically Signed   By: Tollie Ethavid  Kwon M.D.   On: 08/21/2017 23:46    none   Subjective: Limited by dementia.  Daughter states she has been very tired today.  Denies cough, SOB, chest pains, abdominal discomfort, dysuria, urgency or frequency  Discharge Exam: Vitals:   08/22/17 0401 08/22/17 0432  BP: 110/74 139/90  Pulse: 78 84  Resp: 16 16  Temp: 98.4 F (36.9 C) 98 F (36.7 C)  SpO2: 90% 93%   Vitals:   08/22/17 0311 08/22/17 0314 08/22/17 0401 08/22/17 0432  BP: 107/61  110/74 139/90  Pulse: 81  78 84  Resp:   16 16  Temp:   98.4 F (36.9 C) 98 F (36.7 C)  TempSrc:   Oral Oral  SpO2: (!) 88% 94% 90% 93%  Weight:    86 kg (189 lb 9.5 oz)    General: Pt is alert, awake, not in acute distress, lying in bed with nasal canula in place Cardiovascular: RRR, S1/S2 +, no rubs, no gallops Respiratory: CTA bilaterally, no wheezing, no rhonchi Abdominal: Soft, NT, ND, bowel sounds + Extremities: no edema, no cyanosis   The results of significant diagnostics from this hospitalization (including imaging, microbiology, ancillary and laboratory) are listed below for reference.  Microbiology: Recent Results (from the past 240 hour(s))  MRSA PCR Screening     Status: None   Collection Time: 08/22/17  5:00 AM  Result Value Ref Range Status   MRSA by PCR NEGATIVE NEGATIVE Final    Comment:        The GeneXpert MRSA Assay (FDA approved for NASAL specimens only), is one component of a comprehensive MRSA colonization surveillance program. It is not intended to diagnose MRSA infection nor to guide or monitor treatment for MRSA infections.      Labs: BNP (last 3 results) Recent Labs    08/22/17 0126  BNP 18.3   Basic Metabolic Panel: Recent Labs  Lab 08/22/17 0126  NA 137  K 4.0  CL 104  CO2 24  GLUCOSE 119*  BUN 17  CREATININE 0.85  CALCIUM 8.5*   Liver Function Tests: Recent Labs  Lab 08/22/17 0126  AST 30  ALT 26   ALKPHOS 76  BILITOT 0.5  PROT 6.2*  ALBUMIN 3.3*   No results for input(s): LIPASE, AMYLASE in the last 168 hours. No results for input(s): AMMONIA in the last 168 hours. CBC: Recent Labs  Lab 08/22/17 0126  WBC 5.7  HGB 12.9  HCT 39.1  MCV 93.1  PLT 181   Cardiac Enzymes: No results for input(s): CKTOTAL, CKMB, CKMBINDEX, TROPONINI in the last 168 hours. BNP: Invalid input(s): POCBNP CBG: No results for input(s): GLUCAP in the last 168 hours. D-Dimer Recent Labs    08/22/17 0147  DDIMER 0.44   Hgb A1c No results for input(s): HGBA1C in the last 72 hours. Lipid Profile No results for input(s): CHOL, HDL, LDLCALC, TRIG, CHOLHDL, LDLDIRECT in the last 72 hours. Thyroid function studies Recent Labs    08/22/17 0126  TSH 4.700*   Anemia work up No results for input(s): VITAMINB12, FOLATE, FERRITIN, TIBC, IRON, RETICCTPCT in the last 72 hours. Urinalysis    Component Value Date/Time   COLORURINE YELLOW 08/22/2017 0500   APPEARANCEUR CLEAR 08/22/2017 0500   LABSPEC 1.013 08/22/2017 0500   PHURINE 5.0 08/22/2017 0500   GLUCOSEU NEGATIVE 08/22/2017 0500   HGBUR NEGATIVE 08/22/2017 0500   BILIRUBINUR NEGATIVE 08/22/2017 0500   KETONESUR NEGATIVE 08/22/2017 0500   PROTEINUR NEGATIVE 08/22/2017 0500   NITRITE NEGATIVE 08/22/2017 0500   LEUKOCYTESUR LARGE (A) 08/22/2017 0500   Sepsis Labs Invalid input(s): PROCALCITONIN,  WBC,  LACTICIDVEN   Time coordinating discharge: Over 30 minutes  SIGNED:   Renae Fickle, MD  [facility] 08/22/2017, 3:20 PM Pager   If 7PM-7AM, please contact night-coverage [email or web address] Password [email or web address]

## 2017-08-22 NOTE — Clinical Social Work Note (Signed)
Clinical Social Work Assessment  Patient Details  Name: Tamara Fisher MRN: [id number] Date of Birth: March 21, 1941  Date of referral:  08/22/17               Reason for consult:  Discharge Planning, Facility Placement                Permission sought to share information with:  Facility Industrial/product designerContact Representative Permission granted to share information::  Yes, Verbal Permission Granted  Name::        Agency::     Relationship::     Contact Information:     Housing/Transportation Living arrangements for the past 2 months:  Assisted Living Facility[facility] Source of Information:  Adult Children(Daughter in law Tamara Fisher(Tamara Fisher)) Patient Interpreter Needed:  None Criminal Activity/Legal Involvement Pertinent to Current Situation/Hospitalization:  No - Comment as needed Significant Relationships:  Adult Children Lives with:  Facility Resident Do you feel safe going back to the place where you live?  Yes Need for family participation in patient care:  Yes (Comment)  Care giving concerns:  Patient from [facility](Memory Care Unit). Patient's daughter in law reported that patient had a fall at ALF prior to hospitalization and that patient has been experiencing issues with breathing.   Social Worker assessment / plan:  CSW spoke with patient's daughter in law at bedside regarding discharge planning, patient alert but did not participate in assessment. Patient's daughter reported that patient has been a resident at [facility] for approx a year and half and that they provide good care to patient. Patient's daughter in law reported that the plan is for patient to return at discharge and that family will provide transportation. Patient's daughter in law reported that she and patient's son live locally and that patient has a step daughter that is also involved in patient's life.   CSW will complete FL2 and contact patient's facility to confirm patient's ability  to return. CSW will continue to follow and assist with discharge planning.   Employment status:  Retired Database administratornsurance information:  Managed Medicare PT Recommendations:  Not assessed at this time Information / Referral to community resources:     Patient/Family's Response to care:  Patient's daughter in Chief Operating Officerlaw appreciative of CSW assistance with discharge planning. Patient's daughter in law verbalized plan for patient to dc back to ALF when medically stable.  Patient/Family's Understanding of and Emotional Response to Diagnosis, Current Treatment, and Prognosis:  Patient's daughter in law verbalized understanding of patient's diagnosis and current treatment. Patient's daughter reported that patient being SOB is new and that she was not on oxygen at ALF. Patient's daughter in law involved in patient's care and discussed patient's recent decline with memory. Patient's daughter in law informed CSW about memories shared with patient. Patient's daughter in law verbalized plan to use patient's [place] this upcoming holiday season and that patient was excited about the gesture. CSW positively affirmed patient's daughter in law's care/support for patient.   Emotional Assessment Appearance:  Appears stated age Attitude/Demeanor/Rapport:    Affect (typically observed):  Calm Orientation:  Oriented to Self Alcohol / Substance use:  Not Applicable Psych involvement (Current and /or in the community):  No (Comment)  Discharge Needs  Concerns to be addressed:  Care Coordination Readmission within the last 30 days:  No Current discharge risk:  None Barriers to Discharge:  Continued Medical Work up   USG CorporationKimberly L Espen Bethel, LCSW 08/22/2017, 10:46 AM

## 2017-08-22 NOTE — ED Notes (Signed)
Hospitalist at bedside 

## 2017-08-22 NOTE — ED Notes (Signed)
Bed: WA07 Expected date:  Expected time:  Means of arrival:  Comments: 

## 2017-08-22 NOTE — ED Notes (Signed)
Pt ambulated in hall from room down to bathroom with a walker.  Pt denies any dizziness, or SOB while ambulating.  Pt uses walker to assure balance, otherwise steady gait.  Pt O2 ranged between 86-89% while ambulating.

## 2017-08-22 NOTE — Care Management Note (Signed)
Case Management Note  Patient Details  Name: Su Hiltlizabeth Olivier MRN: [id number] Date of Birth: 12-12-1940  Subjective/Objective: PT recc HHPT. From ALF-they will arrange their own HHPT. No further CM needs.                   Action/Plan:d/c home w/HHPT @ ALF.   Expected Discharge Date:  08/22/17               Expected Discharge Plan:  Assisted Living / Rest Home  In-House Referral:  Clinical Social Work  Discharge planning Services  CM Consult  Post Acute Care Choice:    Choice offered to:     DME Arranged:    DME Agency:     HH Arranged:  PT(HHPT-arranged @ ALF) HH Agency:     Status of Service:  Completed, signed off  If discussed at [employer] Length of [employer] Meetings, dates discussed:    Additional Comments:  Lanier ClamMahabir, Isabellarose Kope, RN 08/22/2017, 3:33 PM

## 2017-08-22 NOTE — Progress Notes (Signed)
Report received from A. [place]abe[place],RN. No change in assessment. Tamara Fisher 

## 2017-08-22 NOTE — [place] FL2 (Signed)
[place] MEDICAID FL2 LEVEL OF CARE SCREENING TOOL     IDENTIFICATION  Patient Name: Tamara Fisher Birthdate: [birth year] Sex: female Admission Date (Current Location): 08/21/2017  [facility] [place]   Facility and Address:  [facility],  [address], [place] [id number]      Provider Number: [id number]  Attending Physician Name and Address:  Renae FickleShort, Mackenzie, MD  Relative Name and Phone Number:       Current Level of Care: Hospital Recommended Level of Care: Assisted Living Facility, Memory Care Prior Approval Number:    Date Approved/Denied:   PASRR Number:    Discharge Plan: Other (Comment)(ALF (memory care))    Current Diagnoses: Patient Active Problem List   Diagnosis Date Noted  . Acute respiratory failure with hypoxia (HCC) 08/22/2017  . Fall 08/22/2017  . Hypothyroidism 08/22/2017  . Dementia 08/22/2017  . Hypoalbuminemia 08/22/2017  . Acute cystitis 08/22/2017  . Atelectasis 08/22/2017  . Carotid artery disease (HCC) 08/22/2017    Orientation RESPIRATION BLADDER Height & Weight     Self  Normal Continent Weight: 189 lb 9.5 oz (86 kg) Height:     BEHAVIORAL SYMPTOMS/MOOD NEUROLOGICAL BOWEL NUTRITION STATUS        Diet(regular)  AMBULATORY STATUS COMMUNICATION OF NEEDS Skin   Limited Assist Verbally Normal                       Personal Care Assistance Level of Assistance  Bathing, Feeding, Dressing Bathing Assistance: Limited assistance Feeding assistance: Independent Dressing Assistance: Limited assistance     Functional Limitations Info  Sight, Hearing, Speech Sight Info: Adequate Hearing Info: Adequate Speech Info: Adequate    SPECIAL CARE FACTORS FREQUENCY  PT (By licensed PT), OT (By licensed OT)     PT Frequency: HHPT OT Frequency: HHOT            Contractures Contractures Info: Not present    Additional Factors Info  Code Status, Allergies Code Status Info: Full code Allergies  Info: NKA              Discharge Medications: Medication List     TAKE these medications   acetaminophen 500 MG tablet Commonly known as:  TYLENOL Take 500 mg by mouth 2 (two) times daily. MAY ALSO TAKE AN EXTRA TABLET DAILY FOR BREAKTHROUGH PAIN   ASPERCREME W/LIDOCAINE EX Apply 1 application topically 2 (two) times daily. Apply to bilateral knees   cephALEXin 500 MG capsule Commonly known as:  KEFLEX Take 1 capsule (500 mg total) by mouth 2 (two) times daily for 6 days.   cholecalciferol 1000 units tablet Commonly known as:  VITAMIN D Take 1,000 Units by mouth daily.   clotrimazole 1 % cream Commonly known as:  LOTRIMIN Apply 1 application topically 2 (two) times daily as needed (yeast). Apply to breasts   clotrimazole-betamethasone cream Commonly known as:  LOTRISONE Apply 1 application topically 2 (two) times daily as needed (rash).   Corn Cushions Pads 1 application as directed. Apply corn pad to left second toe at all times when wearing shoes   divalproex 125 MG capsule Commonly known as:  DEPAKOTE SPRINKLE Take 250 mg by mouth 2 (two) times daily.   eucerin cream Apply 1 application topically daily. Apply to both feet   levothyroxine 75 MCG tablet Commonly known as:  SYNTHROID, LEVOTHROID Take 1 tablet (75 mcg total) by mouth daily before breakfast. Start taking on:  08/23/2017 What changed:  medication strength  how much to take   LORazepam 0.5 MG tablet Commonly known as:  ATIVAN Take 0.5 mg by mouth 2 (two) times daily as needed for anxiety.   Melatonin 3 MG Tabs Take 6 mg by mouth at bedtime.   nystatin powder Commonly known as:  MYCOSTATIN/NYSTOP Apply 1 g topically 3 (three) times daily as needed (rash).   OLANZapine 5 MG tablet Commonly known as:  ZYPREXA Take 5 mg by mouth 2 (two) times daily.   ondansetron 4 MG tablet Commonly known as:  ZOFRAN Take 4 mg by mouth every 4 (four) hours as needed for nausea or  vomiting.   sertraline 50 MG tablet Commonly known as:  ZOLOFT Take 75 mg by mouth daily.      Relevant Imaging Results:  Relevant Lab Results:   Additional Information SSN  [id number]  Antionette PolesKimberly L Jaylen Claude, LCSW

## 2017-08-22 NOTE — Progress Notes (Signed)
Pharmacy Antibiotic Note  Tamara Fisher is a 76 y.o. female admitted on 08/21/2017 with UTI.  Pharmacy has been consulted for ceftriaxone dosing.  - Difficult to assess for symptoms of UTI w/ dementia - UA with pyuria  Plan:  Ceftriaxone 1gm IV q24h  No renal dose adjustment required, pharmacy to sign-off  Pyuria does not necessitate use of antibiotics without symptoms of UTI.  Consider stopping antibiotics as clinically warranted if UTI unlikely.    Weight: 189 lb 9.5 oz (86 kg)  Temp (24hrs), Avg:98.2 F (36.8 C), Min:98 F (36.7 C), Max:98.4 F (36.9 C)  Recent Labs  Lab 08/22/17 0126  WBC 5.7  CREATININE 0.85    CrCl cannot be calculated (Unknown ideal weight.).    No Known Allergies   Thank you for allowing pharmacy to be a part of this patient's care.  Juliette Alcideustin Zeigler, PharmD, BCPS.   Pager: [phone] 08/22/2017 8:05 AM

## 2017-08-22 NOTE — Progress Notes (Signed)
Patient returning to [facility]. Facility aware of patient's discharge and confirmed patient's ability to return. CSW provided all requested documents to facility. Patient being transported by family and patient's RN provided with number to call report. CSW signing off, no other needs identified at this time.  Celso SickleKimberly Brittyn Salaz, [place] Clinical Social Worker [facility] Cell#: [phone]

## 2017-08-22 NOTE — Progress Notes (Signed)
Report called to [facility]. Tamara Fisher, Tamara Fisher

## 2017-08-22 NOTE — ED Notes (Signed)
Pt continues to take nasal canula off self. Reapplied.

## 2017-08-22 NOTE — Evaluation (Signed)
Physical Therapy Evaluation Patient Details Name: Tamara Fisher MRN: [id number] DOB: April 02, 1941 Today's Date: 08/22/2017   History of Present Illness  76 yo female admitted with hypoxia, fall at her ALF, atelectasis. Hx of dementia. Pt is from a memory care facility.   Clinical Impression  On eval, pt was Min guard assist for mobility. She walked 150'x1, 100'x1 with a seated break in between walks. O2 sat 93% on RA at rest. It was difficult getting a constant O2 reading during ambulation. Immediately after walking, O2 sat was 92% on RA. Mild dyspnea with ambulation however pt tends to walk at a quick pace. Caregiver stated pt is on a mobility program. Will recommend HHPT f/u at ALF due to recent fall.     Follow Up Recommendations Home Health PT (at ALF)    Equipment Recommendations  None recommended by PT    Recommendations for Other Services       Precautions / Restrictions Precautions Precautions: Fall Restrictions Weight Bearing Restrictions: No      Mobility  Bed Mobility               General bed mobility comments: oob in recliner  Transfers Overall transfer level: Needs assistance Equipment used: Rolling walker (2 wheeled) Transfers: Sit to/from Stand Sit to Stand: Supervision         General transfer comment: for safety  Ambulation/Gait Ambulation/Gait assistance: Min guard Ambulation Distance (Feet): 150 Feet(150'x1, 100'x1) Assistive device: Rolling walker (2 wheeled) Gait Pattern/deviations: Step-through pattern     General Gait Details: close guard for safety. Pt moves quickly. cues for pacing. Difficult getting a constant O2 reading on her while wallking. Immediately after walking, O2 sat 92% on RA.   Stairs            Wheelchair Mobility    Modified Rankin (Stroke Patients Only)       Balance Overall balance assessment: Needs assistance;History of Falls         Standing balance support: Bilateral upper extremity  supported Standing balance-Leahy Scale: Poor                               Pertinent Vitals/Pain Pain Assessment: No/denies pain    Home Living Family/patient expects to be discharged to:: Assisted living(Memory care)       Home Access: Level entry     Home Layout: One level Home Equipment: Walker - 2 wheels      Prior Function Level of Independence: Independent with assistive device(s)               Hand Dominance        Extremity/Trunk Assessment   Upper Extremity Assessment Upper Extremity Assessment: Overall WFL for tasks assessed    Lower Extremity Assessment Lower Extremity Assessment: Overall WFL for tasks assessed    Cervical / Trunk Assessment Cervical / Trunk Assessment: Normal  Communication   Communication: No difficulties  Cognition Arousal/Alertness: Awake/alert Behavior During Therapy: WFL for tasks assessed/performed Overall Cognitive Status: History of cognitive impairments - at baseline                                        General Comments      Exercises     Assessment/Plan    PT Assessment Patient needs continued PT services  PT Problem List Decreased mobility;Decreased activity tolerance  PT Treatment Interventions DME instruction;Gait training;Functional mobility training;Therapeutic activities;Patient/family education;Therapeutic exercise    PT Goals (Current goals can be found in the Care Plan section)  Acute Rehab PT Goals Patient Stated Goal: home PT Goal Formulation: With patient/family Time For Goal Achievement: 09/05/17 Potential to Achieve Goals: Good    Frequency Min 3X/week   Barriers to discharge        Co-evaluation               AM-PAC PT "6 Clicks" Daily Activity  Outcome Measure Difficulty turning over in bed (including adjusting bedclothes, sheets and blankets)?: A Little Difficulty moving from lying on back to sitting on the side of the bed? : A  Little Difficulty sitting down on and standing up from a chair with arms (e.g., wheelchair, bedside commode, etc,.)?: A Little Help needed moving to and from a bed to chair (including a wheelchair)?: A Little Help needed walking in hospital room?: A Little Help needed climbing 3-5 steps with a railing? : A Little 6 Click Score: 18    End of Session   Activity Tolerance: Patient tolerated treatment well Patient left: in chair;with call bell/phone within reach;with family/visitor present   PT Visit Diagnosis: Difficulty in walking, not elsewhere classified (R26.2)    Time: 8295-62131412-1431 PT Time Calculation (min) (ACUTE ONLY): 19 min   Charges:   PT Evaluation $PT Eval Moderate Complexity: 1 Mod     PT G Codes:   PT G-Codes **NOT FOR INPATIENT CLASS** Functional Assessment Tool Used: AM-PAC 6 Clicks Basic Mobility;Clinical judgement Functional Limitation: Mobility: Walking and moving around Mobility: Walking and Moving Around Current Status ([id number]): At least 1 percent but less than 20 percent impaired, limited or restricted Mobility: Walking and Moving Around Goal Status [phone]): At least 1 percent but less than 20 percent impaired, limited or restricted      Rebeca AlertJannie Wyoma Genson, MPT Pager: [phone]

## 2017-08-22 NOTE — H&P (Signed)
History and Physical    Chia Mowers ZOX:[id number] DOB: [birth year] DOA: 08/21/2017  PCP: Patient, No Pcp Per   Patient coming from: Home.  I have personally briefly reviewed patient's old medical records in [facility]  Chief Complaint: Fall.  HPI: Tamara Fisher is a 76 y.o. female with medical history significant of hypothyroidism, dementia who is being brought to the emergency department from the memory care at [facility] after having an unwitnessed fall yesterday evening.  The facility staff mentions hearing a loud crash noise, went to her room and found her laying in the bathtub.  Per facility staff, there was no LOC or other complaints.  Prior to the fall, the patient had taken her evening meds.  Her daughter-in-law also mentioned that she has gained about 25 pounds in the last 4 months.  She also reported that the patient was supposed to follow-up with cardiology for echocardiogram and stress testing, but her appointments got rescheduled.  ED Course: Initial vital signs show a temperature 98.33F, pulse 84, respirations 20, blood pressure 127/92 mmHg and O2 sat 91%.  The patient had a O2 sat level in the mid 80s while ambulating with the staff.  She received supplemental oxygen in the emergency department.   Her work up shows normal CBC, d-dimer, BNP and troponin.  EKG was sinus rhythm with a normal R wave progression, borderline T wave abnormalities and borderline QT prolongation.  The EKG is essentially unchanged from previous tracing.  Chemistry showed a glucose of 119 and calcium of 8.5 mg/dL.  Albumin was 3.3 and total protein 6.2 g/dL, all other hepatic function panel and chemistry values were normal.  Imaging: Chest radiograph showed linear atelectasis.  CT head/C-spine did not show any acute intracranial pathology.  However it showed chronic small vessel disease, bilateral basal ganglia lacunar infarcts and extensive extracranial atherosclerosis of the carotid vessels  among other findings.  Right knee, bilateral hip, lumbar spine and thoracic spine showed chronic degenerative joint disease changes, but no acute abnormality.  Please see images and radiology report for full details.  Review of Systems: Unable to obtain due to impaired memory..    Past Medical History:  Diagnosis Date  . Dementia   . Hypothyroidism     History reviewed. No pertinent surgical history.   reports that  has never smoked. she has never used smokeless tobacco. She reports that she does not drink alcohol or use drugs.  No Known Allergies  Family History  Problem Relation Age of Onset  . Hypertension Other     Prior to Admission medications   Medication Sig Start Date End Date Taking? Authorizing Provider  acetaminophen (TYLENOL) 500 MG tablet Take 500 mg by mouth 2 (two) times daily. MAY ALSO TAKE AN EXTRA TABLET DAILY FOR BREAKTHROUGH PAIN   Yes [provider]  ASPERCREME W/LIDOCAINE EX Apply 1 application topically 2 (two) times daily. Apply to bilateral knees   Yes [provider]  cholecalciferol (VITAMIN D) 1000 units tablet Take 1,000 Units by mouth daily.   Yes [provider]  clotrimazole (LOTRIMIN) 1 % cream Apply 1 application topically 2 (two) times daily as needed (yeast). Apply to breasts   Yes [provider]  clotrimazole-betamethasone (LOTRISONE) cream Apply 1 application topically 2 (two) times daily as needed (rash).   Yes [provider]  divalproex (DEPAKOTE SPRINKLE) 125 MG capsule Take 250 mg by mouth 2 (two) times daily.   Yes [provider]  Foot Care Products (CORN  CUSHIONS) PADS 1 application as directed. Apply corn pad to left second toe at all times when wearing shoes   Yes [provider]  levothyroxine (SYNTHROID, LEVOTHROID) 50 MCG tablet Take 50 mcg by mouth daily before breakfast.   Yes [provider]  LORazepam (ATIVAN) 0.5 MG tablet Take 0.5 mg by mouth 2 (two)  times daily as needed for anxiety.   Yes [provider]  Melatonin 3 MG TABS Take 6 mg by mouth at bedtime.   Yes [provider]  nystatin (MYCOSTATIN/NYSTOP) powder Apply 1 g topically 3 (three) times daily as needed (rash).   Yes [provider]  OLANZapine (ZYPREXA) 5 MG tablet Take 5 mg by mouth 2 (two) times daily.   Yes [provider]  ondansetron (ZOFRAN) 4 MG tablet Take 4 mg by mouth every 4 (four) hours as needed for nausea or vomiting.   Yes [provider]  sertraline (ZOLOFT) 50 MG tablet Take 75 mg by mouth daily.   Yes [provider]  Skin Protectants, Misc. (EUCERIN) cream Apply 1 application topically daily. Apply to both feet   Yes [provider]    Physical Exam: Vitals:   08/21/17 2144 08/22/17 0158 08/22/17 0311 08/22/17 0314  BP: (!) 127/92 119/66 107/61   Pulse: 84 79 81   Resp: 20 13    Temp: 98.2 F (36.8 C)     TempSrc: Oral     SpO2: 91% 92% (!) 88% 94%    Constitutional: NAD, calm, comfortable Eyes: PERRL, lids and conjunctivae normal ENMT: Mucous membranes are moist. Posterior pharynx clear of any exudate or lesions. Neck: normal, supple, no masses, no thyromegaly Respiratory: Decreased breath sounds on bases, otherwise clear to auscultation bilaterally, no wheezing, no crackles. Normal respiratory effort. No accessory muscle use.  Cardiovascular: Regular rate and rhythm, no murmurs / rubs / gallops. No extremity edema. 2+ pedal pulses. No carotid bruits.  Abdomen: Obese, mildly distended, positive small umbilical hernia, soft, no tenderness, no masses palpated. No hepatosplenomegaly. Bowel sounds positive.  Musculoskeletal: no clubbing / cyanosis. No significant joint deformity upper and lower extremities. Good ROM, no contractures. Normal muscle tone.  Mild tenderness on right knee and left shoulder. Skin: no rashes, ulcers, abrasions or lacerations seen limited dermatological  exam. Neurologic: CN 2-12 grossly intact. Sensation intact, DTR normal. Strength 5/5 in all 4.  Psychiatric: Somnolent, but wakes up quickly.  Answers simple questions. Alert and oriented x 2, partially oriented to time/day/situation..     Labs on Admission: I have personally reviewed following labs and imaging studies  CBC: Recent Labs  Lab 08/22/17 0126  WBC 5.7  HGB 12.9  HCT 39.1  MCV 93.1  PLT 181   Basic Metabolic Panel: Recent Labs  Lab 08/22/17 0126  NA 137  K 4.0  CL 104  CO2 24  GLUCOSE 119*  BUN 17  CREATININE 0.85  CALCIUM 8.5*   GFR: CrCl cannot be calculated (Unknown ideal weight.). Liver Function Tests: Recent Labs  Lab 08/22/17 0126  AST 30  ALT 26  ALKPHOS 76  BILITOT 0.5  PROT 6.2*  ALBUMIN 3.3*   No results for input(s): LIPASE, AMYLASE in the last 168 hours. No results for input(s): AMMONIA in the last 168 hours. Coagulation Profile: No results for input(s): INR, PROTIME in the last 168 hours. Cardiac Enzymes: No results for input(s): CKTOTAL, CKMB, CKMBINDEX, TROPONINI in the last 168 hours. BNP (last 3 results) No results for input(s): PROBNP in the  last 8760 hours. HbA1C: No results for input(s): HGBA1C in the last 72 hours. CBG: No results for input(s): GLUCAP in the last 168 hours. Lipid Profile: No results for input(s): CHOL, HDL, LDLCALC, TRIG, CHOLHDL, LDLDIRECT in the last 72 hours. Thyroid Function Tests: No results for input(s): TSH, T4TOTAL, FREET4, T3FREE, THYROIDAB in the last 72 hours. Anemia Panel: No results for input(s): VITAMINB12, FOLATE, FERRITIN, TIBC, IRON, RETICCTPCT in the last 72 hours. Urine analysis: No results found for: COLORURINE, APPEARANCEUR, LABSPEC, PHURINE, GLUCOSEU, HGBUR, BILIRUBINUR, KETONESUR, PROTEINUR, UROBILINOGEN, NITRITE, LEUKOCYTESUR  Radiological Exams on Admission: Dg Chest 2 View  Result Date: 08/21/2017 CLINICAL DATA:  Unwitnessed fall. Sternal pain on examination. Patient reports  no specific complaints. EXAM: CHEST  2 VIEW COMPARISON:  None. FINDINGS: Shallow inspiration with linear atelectasis in the lung bases. Normal heart size and pulmonary vascularity. No focal airspace disease or consolidation in the lungs. No blunting of costophrenic angles. No pneumothorax. Mediastinal contours appear intact. Calcification and torsion of the aorta. Degenerative changes in the spine. Sternum does not appear depressed. IMPRESSION: Shallow inspiration with linear atelectasis in the lung bases. Electronically Signed   By: Burman NievesWilliam  Stevens M.D.   On: 08/21/2017 23:42   Dg Thoracic Spine 2 View  Result Date: 08/21/2017 CLINICAL DATA:  Unwitnessed fall. EXAM: THORACIC SPINE 2 VIEWS COMPARISON:  None. FINDINGS: Anterior compression of the T9 vertebra. No prior comparison studies are available but there are degenerative changes at the endplates, suggesting that this is an old fracture. Vertebral body heights are otherwise preserved. Normal alignment of the thoracic spine. Diffuse degenerative changes throughout with narrowed interspaces and endplate hypertrophic changes. No paraspinal soft tissue swelling. No focal bone lesion or bone destruction. IMPRESSION: Old appearing compression fracture of T9. Diffuse degenerative changes in the thoracic spine. Normal alignment. No acute displaced fractures identified. Electronically Signed   By: Burman NievesWilliam  Stevens M.D.   On: 08/21/2017 23:44   Dg Lumbar Spine Complete  Result Date: 08/21/2017 CLINICAL DATA:  Unwitnessed fall. EXAM: LUMBAR SPINE - COMPLETE 4+ VIEW COMPARISON:  None. FINDINGS: Mild endplate compression deformities at L1-2 and L2-3 levels. This is likely due to osteoporosis. Normal alignment of the lumbar spine. Degenerative changes throughout with narrowed interspaces and endplate hypertrophic changes. No focal bone lesion or bone destruction. Bone cortex appears intact. Vascular calcifications. IMPRESSION: Normal alignment of the lumbar spine.  Ballooning of endplates likely representing osteoporotic changes. No acute displaced fractures are identified. Electronically Signed   By: Burman NievesWilliam  Stevens M.D.   On: 08/21/2017 23:45   Ct Head Wo Contrast  Result Date: 08/22/2017 CLINICAL DATA:  Unwitnessed fall at nursing home. EXAM: CT HEAD WITHOUT CONTRAST CT CERVICAL SPINE WITHOUT CONTRAST TECHNIQUE: Multidetector CT imaging of the head and cervical spine was performed following the standard protocol without intravenous contrast. Multiplanar CT image reconstructions of the cervical spine were also generated. COMPARISON:  09/27/2016 FINDINGS: CT HEAD FINDINGS Brain: Chronic small vessel ischemic disease. Chronic basal ganglial lacunar infarcts. No large vascular territory infarction, hemorrhage or midline shift. No hydrocephalus. No intra-axial mass nor extra-axial fluid collections. Vascular: No hyperdense vessels. Moderate atherosclerosis of the carotid siphons. Skull: Negative for fracture. Sinuses/Orbits: Mild ethmoid sinus mucosal thickening. Intact orbits and globes. Other: Clear bilateral mastoids. CT CERVICAL SPINE FINDINGS Alignment: Maintained cervical lordosis. Intact craniocervical relationship and atlantodental interval. Skull base and vertebrae: No acute fracture. No primary bone lesion or focal pathologic process. Probable left vertebral ectasia accounting for prominence of the left vertebral foramina at multiple levels. Soft  tissues and spinal canal: No prevertebral soft tissue swelling. No visible canal hematoma. Disc levels: Degenerative disc disease and endplate spurring Z6-1 and C6-7. No jumped or perched facets. No central or significant neural foraminal encroachment Upper chest: No acute abnormality at either lung apex. Minimal aortic atherosclerosis. Other: Moderate extracranial carotid arteriosclerosis. IMPRESSION: 1. Chronic small vessel ischemic disease. Chronic bilateral basal ganglial lacunar infarcts. No acute intracranial  abnormality. 2. Degenerative disc disease C5-6 and C6-7. No acute cervical spine fracture or posttraumatic subluxation. Electronically Signed   By: Tollie Eth M.D.   On: 08/22/2017 00:04   Ct Cervical Spine Wo Contrast  Result Date: 08/22/2017 CLINICAL DATA:  Unwitnessed fall at nursing home. EXAM: CT HEAD WITHOUT CONTRAST CT CERVICAL SPINE WITHOUT CONTRAST TECHNIQUE: Multidetector CT imaging of the head and cervical spine was performed following the standard protocol without intravenous contrast. Multiplanar CT image reconstructions of the cervical spine were also generated. COMPARISON:  09/27/2016 FINDINGS: CT HEAD FINDINGS Brain: Chronic small vessel ischemic disease. Chronic basal ganglial lacunar infarcts. No large vascular territory infarction, hemorrhage or midline shift. No hydrocephalus. No intra-axial mass nor extra-axial fluid collections. Vascular: No hyperdense vessels. Moderate atherosclerosis of the carotid siphons. Skull: Negative for fracture. Sinuses/Orbits: Mild ethmoid sinus mucosal thickening. Intact orbits and globes. Other: Clear bilateral mastoids. CT CERVICAL SPINE FINDINGS Alignment: Maintained cervical lordosis. Intact craniocervical relationship and atlantodental interval. Skull base and vertebrae: No acute fracture. No primary bone lesion or focal pathologic process. Probable left vertebral ectasia accounting for prominence of the left vertebral foramina at multiple levels. Soft tissues and spinal canal: No prevertebral soft tissue swelling. No visible canal hematoma. Disc levels: Degenerative disc disease and endplate spurring W9-6 and C6-7. No jumped or perched facets. No central or significant neural foraminal encroachment Upper chest: No acute abnormality at either lung apex. Minimal aortic atherosclerosis. Other: Moderate extracranial carotid arteriosclerosis. IMPRESSION: 1. Chronic small vessel ischemic disease. Chronic bilateral basal ganglial lacunar infarcts. No acute  intracranial abnormality. 2. Degenerative disc disease C5-6 and C6-7. No acute cervical spine fracture or posttraumatic subluxation. Electronically Signed   By: Tollie Eth M.D.   On: 08/22/2017 00:04   Dg Knee Complete 4 Views Right  Result Date: 08/21/2017 CLINICAL DATA:  Unwitnessed fall EXAM: RIGHT KNEE - COMPLETE 4+ VIEW COMPARISON:  None. FINDINGS: There is a small suprapatellar joint effusion. Tricompartmental osteoarthritis is noted without definite fracture nor joint dislocation. Slight shallow depression of the lateral tibial plateau is likely due to osteoarthritis given bone-on-bone appearance on one of the views. IMPRESSION: 1. Tricompartmental osteoarthritis with small joint effusion. 2. No acute fracture nor joint dislocation is apparent. Electronically Signed   By: Tollie Eth M.D.   On: 08/21/2017 23:48   Dg Hips Bilat W Or Wo Pelvis 5 Views  Result Date: 08/21/2017 CLINICAL DATA:  Unwitnessed fall in bathroom. EXAM: DG HIP (WITH OR WITHOUT PELVIS) 5+V BILAT COMPARISON:  None. FINDINGS: There is no evidence of hip fracture or dislocation. Mild degenerative joint space narrowing of both hips left slightly worse than right. Mild degenerative disc disease of the included lower lumbar spine with facet arthropathy. The bony pelvis appears intact. IMPRESSION: 1. Lower lumbar degenerative disc and facet arthropathy. 2. Mild degenerative joint space narrowing both hips. 3. No acute osseous abnormality. Electronically Signed   By: Tollie Eth M.D.   On: 08/21/2017 23:46    EKG: Independently reviewed. Vent. rate 78 BPM PR interval * ms QRS duration 101 ms QT/QTc 426/486 ms P-R-T axes 41 -  67 33 Sinus rhythm Left anterior fascicular block Abnormal R-wave progression, late transition Borderline T abnormalities, anterior leads Borderline prolonged QT interval  No significant change since last tracing  Assessment/Plan Principal Problem:   Hypoxia Atelectasis seen on chest  radiograph. 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 Continue supplemental oxygen. Incentive spirometry.  Active Problems:   Fall Fall precautions. Consider PT evaluation and treatment as an outpatient.    Hypothyroidism Continue levothyroxine 50 mcg p.o. Daily. Family reports 25 pound weight gain recently. Check TSH level.    Dementia Supportive care. Continue Depakote 250 mg p.o. twice daily. Continue lorazepam 0.5 mg p.o. twice daily as needed for anxiety. Continue Zyprexa 5 mg p.o. twice daily. Continue Zoloft 75 mg at p.o. daily.    Hypoalbuminemia Minimally low. Nutritional requirements should be followed as an outpatient.    DVT prophylaxis: Lovenox SQ. Code Status: Full code Family Communication:  Disposition Plan: Observation for hypoxia.  May need further workup if not medication related. Consults called:  Admission status: Observation/telemetry.   Bobette Mo MD [facility] Pager [phone].  If 7PM-7AM, please contact night-coverage [email or web address] Password [email or web address]  08/22/2017, 3:50 AM

## 2017-08-22 NOTE — Care Management Obs Status (Signed)
MEDICARE OBSERVATION STATUS NOTIFICATION   Patient Details  Name: Tamara Fisher MRN: [id number] Date of Birth: 06-07-1941   Medicare Observation Status Notification Given:  Yes    MahabirOlegario Messier, Jermika Olden, RN 08/22/2017, 1:38 PM

## 2017-08-22 NOTE — ED Notes (Signed)
Assigned @0348  room 1410

## 2017-08-25 LAB — URINE CULTURE

## 2017-11-19 ENCOUNTER — Emergency Department (HOSPITAL_COMMUNITY): Payer: Medicare HMO

## 2017-11-19 ENCOUNTER — Emergency Department (HOSPITAL_COMMUNITY)
Admission: EM | Admit: 2017-11-19 | Discharge: 2017-11-19 | Disposition: A | Discharge status: Home / Self Care | Payer: Medicare HMO | Attending: Emergency Medicine | Admitting: Emergency Medicine

## 2017-11-19 ENCOUNTER — Encounter (HOSPITAL_COMMUNITY): Payer: Self-pay | Admitting: Emergency Medicine

## 2017-11-19 DIAGNOSIS — E039 Hypothyroidism, unspecified: Secondary | ICD-10-CM | POA: Insufficient documentation

## 2017-11-19 DIAGNOSIS — R0602 Shortness of breath: Secondary | ICD-10-CM | POA: Insufficient documentation

## 2017-11-19 DIAGNOSIS — R0902 Hypoxemia: Secondary | ICD-10-CM | POA: Diagnosis not present

## 2017-11-19 DIAGNOSIS — R55 Syncope and collapse: Secondary | ICD-10-CM

## 2017-11-19 DIAGNOSIS — F0390 Unspecified dementia without behavioral disturbance: Secondary | ICD-10-CM | POA: Diagnosis not present

## 2017-11-19 DIAGNOSIS — Z79899 Other long term (current) drug therapy: Secondary | ICD-10-CM | POA: Insufficient documentation

## 2017-11-19 DIAGNOSIS — M3210 Systemic lupus erythematosus, organ or system involvement unspecified: Secondary | ICD-10-CM | POA: Insufficient documentation

## 2017-11-19 HISTORY — DX: Aneurysm of carotid artery: I72.0

## 2017-11-19 HISTORY — DX: Systemic lupus erythematosus, unspecified: M32.9

## 2017-11-19 LAB — CBC WITH DIFFERENTIAL/PLATELET
BASOS PCT: 0 %
Basophils Absolute: 0 10*3/uL (ref 0.0–0.1)
EOS ABS: 0.1 10*3/uL (ref 0.0–0.7)
EOS PCT: 1 %
HCT: 39.3 % (ref 36.0–46.0)
Hemoglobin: 12.8 g/dL (ref 12.0–15.0)
LYMPHS ABS: 1.2 10*3/uL (ref 0.7–4.0)
Lymphocytes Relative: 17 %
MCH: 30.7 pg (ref 26.0–34.0)
MCHC: 32.6 g/dL (ref 30.0–36.0)
MCV: 94.2 fL (ref 78.0–100.0)
MONO ABS: 0.9 10*3/uL (ref 0.1–1.0)
MONOS PCT: 13 %
NEUTROS PCT: 69 %
Neutro Abs: 4.8 10*3/uL (ref 1.7–7.7)
PLATELETS: 266 10*3/uL (ref 150–400)
RBC: 4.17 MIL/uL (ref 3.87–5.11)
RDW: 13.4 % (ref 11.5–15.5)
WBC: 7.1 10*3/uL (ref 4.0–10.5)

## 2017-11-19 LAB — BASIC METABOLIC PANEL
Anion gap: 10 (ref 5–15)
BUN: 12 mg/dL (ref 6–20)
CHLORIDE: 104 mmol/L (ref 101–111)
CO2: 28 mmol/L (ref 22–32)
CREATININE: 0.73 mg/dL (ref 0.44–1.00)
Calcium: 8.8 mg/dL — ABNORMAL LOW (ref 8.9–10.3)
GFR calc non Af Amer: >60 mL/min (ref >60)
Glucose, Bld: 91 mg/dL (ref 65–99)
Potassium: 3.6 mmol/L (ref 3.5–5.1)
Sodium: 142 mmol/L (ref 135–145)

## 2017-11-19 LAB — I-STAT TROPONIN, ED
TROPONIN I, POC: 0 ng/mL (ref 0.00–0.08)
Troponin i, poc: 0 ng/mL (ref 0.00–0.08)

## 2017-11-19 LAB — CBG MONITORING, ED: Glucose-Capillary: 78 mg/dL (ref 65–99)

## 2017-11-19 MED ORDER — ALBUTEROL SULFATE HFA 108 (90 BASE) MCG/ACT IN AERS
2.0000 | INHALATION_SPRAY | Freq: Once | RESPIRATORY_TRACT | Status: AC
  Administered 2017-11-19: 2 via RESPIRATORY_TRACT
  Filled 2017-11-19: qty 6.7

## 2017-11-19 MED ORDER — ALBUTEROL SULFATE HFA 108 (90 BASE) MCG/ACT IN AERS: 1.0000 | INHALATION_SPRAY | Freq: Four times a day (QID) | RESPIRATORY_TRACT | 0 refills | Status: DC | PRN

## 2017-11-19 MED ORDER — IOPAMIDOL (ISOVUE-370) INJECTION 76%
INTRAVENOUS | Status: AC
  Administered 2017-11-19: 54 mL
  Filled 2017-11-19: qty 100

## 2017-11-19 NOTE — ED Notes (Signed)
Patient transported to CT 

## 2017-11-19 NOTE — ED Provider Notes (Signed)
[facility]-EMERGENCY DEPT Provider Note   CSN: [id number] Arrival date & time: 11/19/17  1101     History   Chief Complaint Chief Complaint  Patient presents with  . Loss of Consciousness    HPI Tamara Fisher is a 77 y.o. female.  HPI 77 year old Caucasian female past medical history significant for hypothyroidism, dementia, lupus that presents to the emergency department today from [facility] for syncope and hypoxia.  Son is at bedside.  Patient able to give me only limited history due to her dementia.  Son states that patient was recently treated for the flu with Tamiflu approximately 2 weeks ago.  She finished her Tamiflu 1 week.  Patient reports ongoing dry cough.  She was at physical therapy today and had a syncopal episode.  States that the episode lasted approximately 30 seconds when she stood up too quickly.  Patient was assisted to the ground by staff.  Denies any fall, head trauma.  Patient denies any pain at this time.  States that took the patient approximately 5 minutes to get back to baseline.  Per nursing staff patient was oriented at her baseline at this time. Son also reports this as well.  Patient does endorse shortness of breath.  She was noted to be hypoxic on EMS 89% on room air.  According to family and medical history patient was admitted in November to the hospital for hypoxia and falls.  At that time he thought this was due to medications.  Patient had negative d-dimer workup at that time.  She has a pulmonology follow-up next week that her primary care doctor referred her to.  Denies wearing oxygen at baseline.  History of DVT/PE, prolonged immobilization, recent hormone use, hemoptysis, tobacco use.  Patient denies any associated sob, chest pain, diaphoresis, nausea or emesis.  Denies any lightheadedness or dizziness at this time.  Denies any pain.  Pt denies any fever, chill, ha, vision changes, lightheadedness, dizziness,  congestion, neck pain, cp, cough, abd pain, n/v/d, urinary symptoms, change in bowel habits, melena, hematochezia, lower extremity paresthesias.  Past Medical History:  Diagnosis Date  . Aneurysm of left carotid artery (HCC)   . Dementia   . Hypothyroidism   . Lupus     Patient Active Problem List   Diagnosis Date Noted  . Acute respiratory failure with hypoxia (HCC) 08/22/2017  . Fall 08/22/2017  . Hypothyroidism 08/22/2017  . Dementia 08/22/2017  . Hypoalbuminemia 08/22/2017  . Acute cystitis 08/22/2017  . Atelectasis 08/22/2017  . Carotid artery disease (HCC) 08/22/2017    History reviewed. No pertinent surgical history.  OB History    No data available       Home Medications    Prior to Admission medications   Medication Sig Start Date End Date Taking? Authorizing Provider  acetaminophen (TYLENOL) 500 MG tablet Take 500 mg by mouth 2 (two) times daily. MAY ALSO TAKE AN EXTRA TABLET DAILY FOR BREAKTHROUGH PAIN   Yes [provider]  ASPERCREME W/LIDOCAINE EX Apply 1 application topically 2 (two) times daily. Apply to bilateral knees   Yes [provider]  cholecalciferol (VITAMIN D) 1000 units tablet Take 1,000 Units by mouth daily.   Yes [provider]  clotrimazole (LOTRIMIN) 1 % cream Apply 1 application topically 2 (two) times daily as needed (yeast). Apply to breasts   Yes [provider]  clotrimazole-betamethasone (LOTRISONE) cream Apply 1 application topically 2 (two) times daily as needed (Apply to folds with rash.).  Yes [provider]  divalproex (DEPAKOTE SPRINKLE) 125 MG capsule Take 250 mg by mouth 2 (two) times daily with a meal.    Yes [provider]  Foot Care Products (CORN CUSHIONS) PADS 1 application as directed. Apply corn pad to left second toe at all times when wearing shoes   Yes [provider]  levothyroxine (SYNTHROID, LEVOTHROID) 75 MCG tablet Take 1 tablet (75 mcg total) by  mouth daily before breakfast. 08/23/17  Yes Short, Thea Silversmith, MD  LORazepam (ATIVAN) 0.5 MG tablet Take 0.5 mg by mouth 2 (two) times daily as needed for anxiety.   Yes [provider]  Melatonin 3 MG TABS Take 6 mg by mouth at bedtime.   Yes [provider]  nystatin (MYCOSTATIN/NYSTOP) powder Apply 1 g topically 3 (three) times daily as needed (Apply to folds with rash.).    Yes [provider]  OLANZapine (ZYPREXA) 5 MG tablet Take 5 mg by mouth 2 (two) times daily.   Yes [provider]  ondansetron (ZOFRAN) 4 MG tablet Take 4 mg by mouth every 4 (four) hours as needed for nausea.    Yes [provider]  sertraline (ZOLOFT) 50 MG tablet Take 75 mg by mouth daily.   Yes [provider]  Skin Protectants, Misc. (EUCERIN) cream Apply 1 application topically daily. Apply to both feet for dry skin.   Yes [provider]    Family History Family History  Problem Relation Age of Onset  . Hypertension Other     Social History Social History   Tobacco Use  . Smoking status: Never Smoker  . Smokeless tobacco: Never Used  Substance Use Topics  . Alcohol use: No  . Drug use: No     Allergies   Tetracyclines & related and Sulfa antibiotics   Review of Systems Review of Systems  Constitutional: Negative for chills, diaphoresis and fever.  HENT: Negative for congestion.   Eyes: Negative for visual disturbance.  Respiratory: Positive for shortness of breath. Negative for cough.   Cardiovascular: Negative for chest pain, palpitations and leg swelling.  Gastrointestinal: Negative for abdominal pain, diarrhea, nausea and vomiting.  Genitourinary: Negative for dysuria, flank pain, frequency, hematuria and urgency.  Musculoskeletal: Negative for arthralgias and myalgias.  Skin: Negative for rash.  Neurological: Positive for syncope. Negative for dizziness, weakness, light-headedness, numbness and headaches.    Psychiatric/Behavioral: Negative for sleep disturbance. The patient is not nervous/anxious.      Physical Exam Updated Vital Signs BP 117/65   Pulse 79   Temp 98.6 F (37 C) (Oral)   Resp 16   SpO2 93%   Physical Exam  Constitutional: She is oriented to person, place, and time. She appears well-developed and well-nourished.  Non-toxic appearance. No distress.  HENT:  Head: Normocephalic and atraumatic.  Nose: Nose normal.  Mouth/Throat: Oropharynx is clear and moist.  Eyes: Conjunctivae and EOM are normal. Pupils are equal, round, and reactive to light. Right eye exhibits no discharge. Left eye exhibits no discharge.  Neck: Normal range of motion. Neck supple. No JVD present. No tracheal deviation present.  No c spine midline tenderness. No paraspinal tenderness. No deformities or step offs noted. Full ROM. Supple. No nuchal rigidity.    Cardiovascular: Normal rate, regular rhythm, normal heart sounds and intact distal pulses. Exam reveals no gallop and no friction rub.  No murmur heard. Pulmonary/Chest: Effort normal and breath sounds normal. No stridor. No respiratory distress. She has no wheezes. She has no  rales. She exhibits no tenderness.  Hypoxic at room air 88%.  Abdominal: Soft. Bowel sounds are normal. She exhibits no distension. There is no tenderness. There is no rebound and no guarding.  Musculoskeletal: Normal range of motion.  No lower extremity edema or calf tenderness.  No midline T spine or L spine tenderness. No deformities or step offs noted. Full ROM. Pelvis is stable.  DP pulses 2+ bilaterally.  Radial pulses are 2+ bilaterally.  Brisk cap refill.  No pitting edema in the lower extremities.  Lymphadenopathy:    She has no cervical adenopathy.  Neurological: She is alert and oriented to person, place, and time.  Patient moving all extremities any difficulties.  Good grip strength and dorsiflexion and plantarflexion of the feet bilaterally.  Sensation  appears intact.  Normal patellar reflexes.  Normal brachial reflexes.  Skin: Skin is warm and dry. Capillary refill takes less than 2 seconds. She is not diaphoretic.  Psychiatric: Her behavior is normal. Judgment and thought content normal.  Nursing note and vitals reviewed.    ED Treatments / Results  Labs (all labs ordered are listed, but only abnormal results are displayed) Labs Reviewed  BASIC METABOLIC PANEL - Abnormal; Notable for the following components:      Result Value   Calcium 8.8 (*)    All other components within normal limits  CBC WITH DIFFERENTIAL/PLATELET  URINALYSIS, ROUTINE W REFLEX MICROSCOPIC  CBG MONITORING, ED  CBG MONITORING, ED  I-STAT TROPONIN, ED    EKG  EKG Interpretation  Date/Time:  Monday November 19 2017 12:02:24 EST Ventricular Rate:  84 PR Interval:    QRS Duration: 103 QT Interval:  416 QTC Calculation: 489 R Axis:   -69 Text Interpretation:  Sinus arrhythmia Ventricular premature complex Left anterior fascicular block Probable anterior infarct, age indeterminate no wpw prolonged qt or brugada Otherwise no significant change Confirmed by Melene Plan [phone]) on 11/19/2017 12:49:28 PM       Radiology Dg Chest 2 View  Result Date: 11/19/2017 CLINICAL DATA:  Cough, congestion and hypoxia. EXAM: CHEST  2 VIEW COMPARISON:  08/21/2017 chest radiograph FINDINGS: Upper limits normal heart size and pulmonary vascular congestion noted. Elevated right hemidiaphragm again noted. Subsegmental atelectasis within the mid right lung noted. Left basilar opacity/atelectasis again noted. No definite pleural effusion or pneumothorax noted. Mild anterior wedging of a lower thoracic vertebra is unchanged. IMPRESSION: 1. Subsegmental atelectasis within the mid right lung and persistent left basilar opacity/atelectasis. Electronically Signed   By: Harmon Pier M.D.   On: 11/19/2017 12:39   Ct Head Wo Contrast  Result Date: 11/19/2017 CLINICAL DATA:  Syncope. EXAM:  CT HEAD WITHOUT CONTRAST TECHNIQUE: Contiguous axial images were obtained from the base of the skull through the vertex without intravenous contrast. COMPARISON:  CT scan of August 21, 2017. FINDINGS: Brain: Mild chronic ischemic white matter disease is noted. 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 No mass effect or midline shift is noted. Ventricular size is within normal limits. There is no evidence of mass lesion, hemorrhage or acute infarction. Vascular: No hyperdense vessel or unexpected calcification. Skull: Normal. Negative for fracture or focal lesion. Sinuses/Orbits: No acute finding. Other: None. IMPRESSION: Mild chronic ischemic white matter disease. No acute intracranial abnormality seen. Electronically Signed   By: Lupita Raider, M.D.   On: 11/19/2017 12:28   Ct Angio Chest Pe W/cm &/or Wo Cm  Result Date: 11/19/2017 CLINICAL DATA:  77 year old female with acute syncope and cough. EXAM: CT  ANGIOGRAPHY CHEST WITH CONTRAST TECHNIQUE: Multidetector CT imaging of the chest was performed using the standard protocol during bolus administration of intravenous contrast. Multiplanar CT image reconstructions and MIPs were obtained to evaluate the vascular anatomy. CONTRAST:  54mL ISOVUE-370 IOPAMIDOL (ISOVUE-370) INJECTION 76% COMPARISON:  11/19/2017 and prior radiographs FINDINGS: Cardiovascular: This is a technically satisfactory study. No pulmonary emboli are identified. Mild cardiomegaly present. Coronary artery and mild thoracic atherosclerotic calcifications identified. There is no evidence of thoracic aortic aneurysm or pericardial effusion. Mediastinum/Nodes: No enlarged mediastinal, hilar, or axillary lymph nodes. Thyroid gland, trachea, and esophagus demonstrate no significant findings. Lungs/Pleura: Scattered subsegmental atelectasis noted within bilateral lower lobes, lingula and inferior right lower lobe. No definite airspace disease or mass noted. There is no evidence of  pleural effusion or pneumothorax. Upper Abdomen: No acute abnormality Musculoskeletal: No acute or suspicious bony abnormalities. Review of the MIP images confirms the above findings. IMPRESSION: 1. No evidence of pulmonary emboli 2. Scattered subsegmental atelectasis within bilateral lower lobes, lingula and inferior right lower lobe. 3. Coronary artery and Aortic Atherosclerosis (ICD10-I70.0). 4. Mild cardiomegaly. Electronically Signed   By: Harmon Pier M.D.   On: 11/19/2017 15:46    Procedures Procedures (including critical care time)  Medications Ordered in ED Medications - No data to display   Initial Impression / Assessment and Plan / ED Course  I have reviewed the triage vital signs and the nursing notes.  Pertinent labs & imaging results that were available during my care of the patient were reviewed by me and considered in my medical decision making (see chart for details).     Patient presents to the emergency department today after syncopal episode with continued hypoxia.  Patient admitted to the hospital November for hypoxia with normal workup.  Patient does have pulmonology appointment tomorrow.  She did have a syncopal episode today after standing up too quickly while at physical therapy.  Denies any head injury or.  Patient is overall well-appearing and nontoxic.  She has been satting in the low 90s on room air she was placed on 2 L of oxygen satting.  Lungs clear to auscultation bilaterally.  No signs of fluid overload including pitting edema or JVD.  Heart regular rate and rhythm.  No focal abdominal tenderness on palpation.  No focal neuro deficit noted.  No signs of intracranial, intrathoracic or intra-abdominal trauma.  Patient's lab work has been reassuring.  No leukocytosis.  Electrolytes are reassuring.  Negative troponin.  Chest x-ray shows atelectasis but no signs of focal infiltrate.  CT scan of head was unremarkable.  EKG similar to prior tracing without any acute  findings.  Given patient's hypoxia and syncope will obtain CT PE study.  This returned was unremarkable for any acute findings of PE or pneumonia.  She has mild cardiomegaly and atelectasis.  Patient was able to ambulate in the ED with saturations at 85%.  Patient denied any shortness of breath or chest pain.  Shambley with a walker appears to be at her baseline.  She was returned to the room without oxygen she was having a stent.  She states that she felt at baseline.  Patient is low risk on the [place] syncope rule.  Patient has no known congestive heart failure history.  However given her prolonged hypoxia with a new episode of syncope today I offered patient and son at bedside admission.  I did coordinate with pulmonology to have them see patient in the hospital as a consult tomorrow.  Son  and patient has voiced the concern that they do not want to be admitted because they want to follow-up with her pulmonologist in the office tomorrow.  Discussed with him that patient would benefit from admission for further workup however they state that they would like to be discharged.  I discussed risk versus benefits of leaving and not being admitted and they have verbalized understanding including the risk of death.  Again patient did have a recent flu.  However there is no wheezing on exam.  May be bronchospasms.  Will give albuterol inhaler.  Patient when she was admitted in November of last year for same had a normal workup at that time.  Patient has no signs of fluid overload.  Do not feel this is a CHF exacerbation.  Patient likely does need echocardiogram.  Clinical presentation not seem consistent with ACS or dissection.    Final Clinical Impressions(s) / ED Diagnoses   Final diagnoses:  Syncope and collapse  Hypoxia    ED Discharge Orders    None       Rise Mu, PA-C 11/19/17 1708    Melene Plan, DO 11/19/17 1916

## 2017-11-19 NOTE — ED Notes (Signed)
Pt ambulated in hall under NKD.  Denies complaints of SOB, or dizziness while ambulating with assisted device(walker).  O2 remained within the range or 84-87% while ambulated.  Pt had to be reminded to slow down and remember to not hold her breath.  Pt comfortably back in bed O2 sats @ 94% without O2.  RN notified.

## 2017-11-19 NOTE — ED Triage Notes (Addendum)
Per EMS. Pt from [facility]. Staff reports pt has been recently treated for the flu and finished with her home tamiflu a week ago. Only noted symptom was a dry cough. Pt was at physical therapy and had a syncopal episode lasting about 30 seconds when she was stood up too quickly. Pt was assisted to the ground by staff; no falls. Staff report it took about 5 minutes for pt to get back to baseline. Pt has hx of dementia, and was oriented per norm upon EMS arrival.

## 2017-11-19 NOTE — ED Notes (Signed)
Bed: [id number] Expected date:  Expected time:  Means of arrival:  Comments: EMS-Loss of consciousness

## 2017-11-19 NOTE — Discharge Instructions (Signed)
I have offered you admission however understand that she will to follow-up with primary care and pulmonology doctors tomorrow.  Please return to the ED with any worsening symptoms.

## 2018-05-20 ENCOUNTER — Other Ambulatory Visit: Payer: Self-pay

## 2018-05-20 ENCOUNTER — Emergency Department (HOSPITAL_COMMUNITY): Payer: Medicare HMO

## 2018-05-20 ENCOUNTER — Encounter (HOSPITAL_COMMUNITY): Payer: Self-pay | Admitting: Obstetrics and Gynecology

## 2018-05-20 ENCOUNTER — Emergency Department (HOSPITAL_COMMUNITY)
Admission: EM | Admit: 2018-05-20 | Discharge: 2018-05-21 | Disposition: A | Discharge status: Skilled Nursing Facility | Payer: Medicare HMO | Attending: Emergency Medicine | Admitting: Emergency Medicine

## 2018-05-20 DIAGNOSIS — F0390 Unspecified dementia without behavioral disturbance: Secondary | ICD-10-CM | POA: Insufficient documentation

## 2018-05-20 DIAGNOSIS — W07XXXA Fall from chair, initial encounter: Secondary | ICD-10-CM | POA: Diagnosis not present

## 2018-05-20 DIAGNOSIS — Y999 Unspecified external cause status: Secondary | ICD-10-CM | POA: Diagnosis not present

## 2018-05-20 DIAGNOSIS — S0003XA Contusion of scalp, initial encounter: Secondary | ICD-10-CM | POA: Insufficient documentation

## 2018-05-20 DIAGNOSIS — Z79899 Other long term (current) drug therapy: Secondary | ICD-10-CM | POA: Insufficient documentation

## 2018-05-20 DIAGNOSIS — Y92128 Other place in nursing home as the place of occurrence of the external cause: Secondary | ICD-10-CM | POA: Diagnosis not present

## 2018-05-20 DIAGNOSIS — Y9389 Activity, other specified: Secondary | ICD-10-CM | POA: Diagnosis not present

## 2018-05-20 DIAGNOSIS — E039 Hypothyroidism, unspecified: Secondary | ICD-10-CM | POA: Insufficient documentation

## 2018-05-20 DIAGNOSIS — S0990XA Unspecified injury of head, initial encounter: Secondary | ICD-10-CM | POA: Diagnosis present

## 2018-05-20 DIAGNOSIS — W19XXXA Unspecified fall, initial encounter: Secondary | ICD-10-CM

## 2018-05-20 NOTE — ED Notes (Signed)
Bed: WA03 Expected date:  Expected time:  Means of arrival:  Comments: EMS/elderly/fall 

## 2018-05-20 NOTE — ED Triage Notes (Signed)
Per EMS: Pt is coming from [facility], pt has an unsteady gait and had an unwitnessed fall. Pt was positive for orthostatic per EMS.  Pt is alert and oriented to baseline, hx of dementia. Pt pulled out her IV, but has received 400 cc of fluid before that.  CBG 127

## 2018-05-20 NOTE — Discharge Instructions (Addendum)
Return here as needed. Follow up with your doctor. °

## 2018-05-22 NOTE — ED Provider Notes (Signed)
[facility]-EMERGENCY DEPT Provider Note   CSN: [id number] Arrival date & time: 05/20/18  1923     History   Chief Complaint Chief Complaint  Patient presents with  . Fall    HPI Tamara Hiltlizabeth Knippenberg is a 77 y.o. female.  HPI Patient presents to the emergency department on a fall that occurred just prior to arrival.  The patient states that she was attempting to sit down when she fell.  She states she did strike her head against the chair.  This was an unwitnessed fall by the staff.  The patient does have some dementia so she is not able to give me a full history of the events.  Patient is at her baseline mental status.  The son is in the room providing history as well.  Patient did strike the left side of her head and does have a small abrasion and contusion Past Medical History:  Diagnosis Date  . Aneurysm of left carotid artery (HCC)   . Dementia   . Hypothyroidism   . Lupus [facility](HCC)     Patient Active Problem List   Diagnosis Date Noted  . Acute respiratory failure with hypoxia (HCC) 08/22/2017  . Fall 08/22/2017  . Hypothyroidism 08/22/2017  . Dementia 08/22/2017  . Hypoalbuminemia 08/22/2017  . Acute cystitis 08/22/2017  . Atelectasis 08/22/2017  . Carotid artery disease (HCC) 08/22/2017    History reviewed. No pertinent surgical history.   OB History   None      Home Medications    Prior to Admission medications   Medication Sig Start Date End Date Taking? Authorizing Provider  acetaminophen (TYLENOL) 500 MG tablet Take 500 mg by mouth 2 (two) times daily. MAY ALSO TAKE AN EXTRA TABLET DAILY FOR BREAKTHROUGH PAIN   Yes [provider]  albuterol (PROVENTIL HFA;VENTOLIN HFA) 108 (90 Base) MCG/ACT inhaler Inhale 1-2 puffs into the lungs every 6 (six) hours as needed for wheezing or shortness of breath. 11/19/17  Yes Rise MuLeaphart, Kenneth T, PA-C  ASPERCREME W/LIDOCAINE EX Apply 1 application topically 2 (two) times daily. Apply to bilateral  knees   Yes [provider]  cholecalciferol (VITAMIN D) 1000 units tablet Take 1,000 Units by mouth daily.   Yes [provider]  clotrimazole (LOTRIMIN) 1 % cream Apply 1 application topically 2 (two) times daily as needed (yeast). Apply to breasts   Yes [provider]  clotrimazole-betamethasone (LOTRISONE) cream Apply 1 application topically 2 (two) times daily as needed (Apply to folds with rash.).    Yes [provider]  divalproex (DEPAKOTE SPRINKLE) 125 MG capsule Take 250 mg by mouth 3 (three) times daily.    Yes [provider]  guaiFENesin (ROBITUSSIN) 100 MG/5ML liquid Take 10 mLs by mouth every 4 (four) hours as needed for cough. 11/22/17  Yes [provider]  levothyroxine (SYNTHROID, LEVOTHROID) 75 MCG tablet Take 1 tablet (75 mcg total) by mouth daily before breakfast. 08/23/17  Yes Short, Thea SilversmithMackenzie, MD  LORazepam (ATIVAN) 0.5 MG tablet Take 0.5 mg by mouth 2 (two) times daily as needed for anxiety.   Yes [provider]  Melatonin 3 MG TBDP Take 6 mg by mouth at bedtime.    Yes [provider]  nystatin (MYCOSTATIN/NYSTOP) powder Apply 1 g topically 3 (three) times daily as needed (Apply to folds with rash.).    Yes [provider]  OLANZapine (ZYPREXA) 10 MG tablet Take 5 mg by mouth 2 (two) times daily.    Yes [provider]  ondansetron (ZOFRAN) 4 MG tablet Take 4 mg by mouth every 4 (four) hours as needed for nausea.    Yes [provider]  sertraline (ZOLOFT) 100 MG tablet Take 100 mg by mouth daily.    Yes [provider]  Skin Protectants, Misc. (EUCERIN) cream Apply 1 application topically daily. Apply to both feet for dry skin.   Yes [provider]  Foot Care Products (CORN CUSHIONS) PADS 1 application as directed. Apply corn pad to left second toe at all times when wearing shoes    [provider]    Family History Family History  Problem  Relation Age of Onset  . Hypertension Other     Social History Social History   Tobacco Use  . Smoking status: Never Smoker  . Smokeless tobacco: Never Used  Substance Use Topics  . Alcohol use: No  . Drug use: No     Allergies   Tetracyclines & related and Sulfa antibiotics   Review of Systems Review of Systems All other systems negative except as documented in the HPI. All pertinent positives and negatives as reviewed in the HPI.  Physical Exam Updated Vital Signs BP (!) 128/111 (BP Location: Right Arm)   Pulse 90   Temp 98.3 F (36.8 C) (Oral)   Resp 16   SpO2 93%   Physical Exam  Constitutional: She is oriented to person, place, and time. She appears well-developed and well-nourished. No distress.  HENT:  Head: Normocephalic.    Mouth/Throat: Oropharynx is clear and moist.  Eyes: Pupils are equal, round, and reactive to light.  Neck: Normal range of motion. Neck supple.  Cardiovascular: Normal rate, regular rhythm and normal heart sounds. Exam reveals no gallop and no friction rub.  No murmur heard. Pulmonary/Chest: Effort normal and breath sounds normal. No respiratory distress. She has no wheezes.  Abdominal: Soft. Bowel sounds are normal. She exhibits no distension. There is no tenderness.  Neurological: She is alert and oriented to person, place, and time. She exhibits normal muscle tone. Coordination normal.  Skin: Skin is warm and dry. Capillary refill takes less than 2 seconds. No rash noted. No erythema.  Psychiatric: She has a normal mood and affect. Her behavior is normal.  Nursing note and vitals reviewed.    ED Treatments / Results  Labs (all labs ordered are listed, but only abnormal results are displayed) Labs Reviewed - No data to display  EKG None  Radiology Ct Head Wo Contrast  Result Date: 05/20/2018 CLINICAL DATA:  Head trauma, minor, GCS>=13, high clinical risk, initial exam. Unwitnessed fall. EXAM: CT HEAD WITHOUT CONTRAST  TECHNIQUE: Contiguous axial images were obtained from the base of the skull through the vertex without intravenous contrast. COMPARISON:  Head CT 11/19/2017 FINDINGS: Brain: Similar degree of atrophy and chronic small vessel ischemia from prior exam. 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 No intracranial hemorrhage, mass effect, or midline shift. No hydrocephalus. The basilar cisterns are patent. No evidence of territorial infarct or acute ischemia. No extra-axial or intracranial fluid collection. Vascular: Atherosclerosis of skullbase vasculature without hyperdense vessel or abnormal calcification. Skull: No fracture or focal lesion. Sinuses/Orbits: Paranasal sinuses and mastoid air cells are clear. The visualized orbits are unremarkable. Other: Left frontoparietal scalp hematoma. IMPRESSION: 1. Left scalp hematoma. No acute intracranial abnormality. No skull fracture. 2. Unchanged atrophy and chronic small vessel ischemia. Electronically Signed   By: Rubye OaksMelanie  Ehinger M.D.   On: 05/20/2018 21:22  Procedures Procedures (including critical care time)  Medications Ordered in ED Medications - No data to display   Initial Impression / Assessment and Plan / ED Course  I have reviewed the triage vital signs and the nursing notes.  Pertinent labs & imaging results that were available during my care of the patient were reviewed by me and considered in my medical decision making (see chart for details).     Patient has negative CT scan of the head and neck.  The patient will be referred back to her primary doctor.  Told to return here as needed.  Patient does not need a suturing to the scalp area.  The patient is advised to return for any worsening in her condition son and patient agreed with the plan and all questions were answered the patient does not have any neurological deficits noted on exam.  Final Clinical Impressions(s) / ED Diagnoses   Final diagnoses:    Fall, initial encounter  Hematoma of scalp, initial encounter    ED Discharge Orders    None       Kyra Manges 05/22/18 0116    Bethann Berkshire, MD 05/22/18 [phone]

## 2018-07-25 ENCOUNTER — Other Ambulatory Visit: Payer: Self-pay

## 2018-07-25 ENCOUNTER — Non-Acute Institutional Stay (SKILLED_NURSING_FACILITY): Payer: Medicare HMO | Admitting: Adult Health

## 2018-07-25 ENCOUNTER — Encounter: Payer: Self-pay | Admitting: Adult Health

## 2018-07-25 DIAGNOSIS — M329 Systemic lupus erythematosus, unspecified: Secondary | ICD-10-CM

## 2018-07-25 DIAGNOSIS — F0393 Unspecified dementia, unspecified severity, with mood disturbance: Secondary | ICD-10-CM

## 2018-07-25 DIAGNOSIS — F0280 Dementia in other diseases classified elsewhere without behavioral disturbance: Secondary | ICD-10-CM

## 2018-07-25 DIAGNOSIS — F0391 Unspecified dementia with behavioral disturbance: Secondary | ICD-10-CM | POA: Diagnosis not present

## 2018-07-25 DIAGNOSIS — F03918 Unspecified dementia, unspecified severity, with other behavioral disturbance: Secondary | ICD-10-CM

## 2018-07-25 DIAGNOSIS — F329 Major depressive disorder, single episode, unspecified: Secondary | ICD-10-CM

## 2018-07-25 DIAGNOSIS — E034 Atrophy of thyroid (acquired): Secondary | ICD-10-CM | POA: Diagnosis not present

## 2018-07-25 DIAGNOSIS — F01518 Vascular dementia, unspecified severity, with other behavioral disturbance: Secondary | ICD-10-CM

## 2018-07-25 DIAGNOSIS — F0151 Vascular dementia with behavioral disturbance: Secondary | ICD-10-CM

## 2018-07-25 LAB — BASIC METABOLIC PANEL
BUN: 29 — AB (ref 4–21)
Creatinine: 0.8 (ref 0.5–1.1)
Glucose: 166
POTASSIUM: 4.4 (ref 3.4–5.3)
Sodium: 141 (ref 137–147)

## 2018-07-25 LAB — HEPATIC FUNCTION PANEL
ALT: 25 (ref 7–35)
AST: 49 — AB (ref 13–35)
Alkaline Phosphatase: 117 (ref 25–125)
Bilirubin, Total: 0.6

## 2018-07-25 LAB — CBC AND DIFFERENTIAL
HEMATOCRIT: 45 (ref 36–46)
Hemoglobin: 15.2 (ref 12.0–16.0)
NEUTROS ABS: 3
PLATELETS: 148 — AB (ref 150–399)
WBC: 4.4

## 2018-07-25 LAB — VITAMIN D 25 HYDROXY (VIT D DEFICIENCY, FRACTURES): Vit D, 25-Hydroxy: 40.39

## 2018-07-25 LAB — TSH: TSH: 1.69 (ref 0.41–5.90)

## 2018-07-25 MED ORDER — LORAZEPAM 0.5 MG PO TABS: 0.5000 mg | ORAL_TABLET | Freq: Two times a day (BID) | ORAL | 0 refills | Status: DC

## 2018-07-25 NOTE — Progress Notes (Signed)
Location:   [facility] Room Number: 111 A Place of Service:  SNF (31)   CODE STATUS: DNR  Allergies  Allergen Reactions  . Tetracyclines & Related Nausea And Vomiting  . Sulfa Antibiotics Rash    Chief Complaint  Patient presents with  . Acute Visit    Transfer in    HPI:  She is a 77 year old woman who has been brought to this facility from her home. Her adl needs have not been able to met at home environment. She is unable to fully participate in the hpi or ros. She is soft spoken. There are no reports of any uncontrolled pain; no anxiety or agitation; no changes in appetite. She will continue to be followed for her chronic illnesses including: hypothyroidism; lupus; depression.    Past Medical History:  Diagnosis Date  . Aneurysm of left carotid artery ([place])   . Dementia ([place])   . Hypothyroidism   . Lupus ([place])     History reviewed. No pertinent surgical history.  Social History   Socioeconomic History  . Marital status: Divorced    Spouse name: Not on file  . Number of children: Not on file  . Years of education: Not on file  . Highest education level: Not on file  Occupational History  . Not on file  Social Needs  . Financial resource strain: Not on file  . Food insecurity:    Worry: Not on file    Inability: Not on file  . Transportation needs:    Medical: Not on file    Non-medical: Not on file  Tobacco Use  . Smoking status: Never Smoker  . Smokeless tobacco: Never Used  Substance and Sexual Activity  . Alcohol use: No  . Drug use: No  . Sexual activity: Not Currently  Lifestyle  . Physical activity:    Days per week: Not on file    Minutes per session: Not on file  . Stress: Not on file  Relationships  . Social connections:    Talks on phone: Not on file    Gets together: Not on file    Attends religious service: Not on file    Active member of club or organization: Not on file    Attends meetings of clubs or  organizations: Not on file    Relationship status: Not on file  . Intimate partner violence:    Fear of current or ex partner: Not on file    Emotionally abused: Not on file    Physically abused: Not on file    Forced sexual activity: Not on file  Other Topics Concern  . Not on file  Social History Narrative  . Not on file   Family History  Problem Relation Age of Onset  . Hypertension Other       VITAL SIGNS BP 102/60   Pulse 91   Temp (!) 97.5 F (36.4 C)   Resp 16   Ht _0  (1.676 m)   Wt 175 lb 12.8 oz (79.7 kg)   SpO2 96%   BMI 28.37 kg/m   Outpatient Encounter Medications as of 07/25/2018  Medication Sig Note  . acetaminophen (TYLENOL) 500 MG tablet Take 500 mg by mouth 2 (two) times daily. MAY ALSO TAKE AN EXTRA TABLET DAILY FOR BREAKTHROUGH PAIN   . cholecalciferol (VITAMIN D) 1000 units tablet Take 1,000 Units by mouth daily.   . divalproex (DEPAKOTE SPRINKLE) 125 MG capsule Take 250 mg by mouth  3 (three) times daily.    Marland Kitchen levothyroxine (SYNTHROID, LEVOTHROID) 75 MCG tablet Take 1 tablet (75 mcg total) by mouth daily before breakfast.   . LORazepam (ATIVAN) 0.5 MG tablet Take 0.5 mg by mouth 2 (two) times daily as needed for anxiety.   . Melatonin 3 MG TBDP Take 6 mg by mouth at bedtime.    . NON FORMULARY Diet type: Regular diet - regular texture   . OLANZapine (ZYPREXA) 10 MG tablet Take 5 mg by mouth 2 (two) times daily.    . ondansetron (ZOFRAN) 4 MG tablet Take 4 mg by mouth every 4 (four) hours as needed for nausea.    . sertraline (ZOLOFT) 100 MG tablet Take 100 mg by mouth daily.     No facility-administered encounter medications on file as of 07/25/2018.      SIGNIFICANT DIAGNOSTIC EXAMS  LABS REVIEWED TODAY:   07-25-18: wbc 4.4; hgb 15.2; hct 45.0; mcv 97.6; plt 148; glucose 166; bun 29.1;creat 0.80; k+ 4.4; na++ 141; ca 9.0; alt 49; albumin 3.9; tsh 1.69; vit D 40.39; depakote 33   Review of Systems  Unable to perform ROS: Dementia  (confusion )   Physical Exam  Constitutional: She appears well-developed and well-nourished. No distress.  HENT:  Head: Atraumatic.  Right Ear: External ear normal.  Left Ear: External ear normal.  Eyes: Conjunctivae are normal.  Neck: No thyromegaly present.  Unable to hear carotid bruit as she is unable to hold breath   Cardiovascular: Normal rate, regular rhythm and intact distal pulses.  Murmur heard. 1/6  Pulmonary/Chest: Effort normal and breath sounds normal. No respiratory distress.  Abdominal: Soft. Bowel sounds are normal. She exhibits no distension. There is no tenderness.  Musculoskeletal: She exhibits no edema.  Is able to move all extremities Kyphosis  Uses walker   Lymphadenopathy:    She has no cervical adenopathy.  Neurological: She is alert.  Skin: Skin is warm and dry. She is not diaphoretic.  Psychiatric: She has a normal mood and affect.      ASSESSMENT/ PLAN:  TODAY;   1.  Hypothyroidism due to acquired atrophy of thyroid: is stable tsh 1.69; will continue synthroid 75 mcg daily   2. Lupus: is without change; currently not on treatment will monitor will continue tylenol 500 mg twice daily for pain.   3. Depression due to dementia; is emotionally stable will continue zoloft 100 mg daily and has ativan 0.5 mg twice daily as needed for the next 14 days. Take melatonin 6 mg nightly   4. Psychosis in elderly with behavioral disturbance: is emotionally stable there are no reports at this time of hallucinations or delusions. Will continue zyprexa 5 mg twice daily and depakote 250 mg three times daily to stabilize mood  5. Vascular dementia with behavioral disturbance: is without change weight is 175 pounds; will monitor        MD is aware of resident's narcotic use and is in agreement with current plan of care. We will attempt to wean resident as apropriate   Ok Edwards NP [facility]  Contact [phone] Monday through Friday 8am-  5pm  After hours call [phone]

## 2018-07-25 NOTE — Telephone Encounter (Signed)
Rx faxed to [facility] (P) [phone], (F) [phone] 

## 2018-07-29 LAB — HEPATIC FUNCTION PANEL
ALT: 33 (ref 7–35)
AST: 53 — AB (ref 13–35)
Alkaline Phosphatase: 115 (ref 25–125)
Bilirubin, Total: 0.7

## 2018-07-29 LAB — BASIC METABOLIC PANEL
BUN: 28 — AB (ref 4–21)
Creatinine: 1 (ref 0.5–1.1)
Glucose: 155
Potassium: 4.4 (ref 3.4–5.3)
Sodium: 141 (ref 137–147)

## 2018-07-29 LAB — VITAMIN D 25 HYDROXY (VIT D DEFICIENCY, FRACTURES): Vit D, 25-Hydroxy: 49.81

## 2018-07-29 LAB — CBC AND DIFFERENTIAL
HEMATOCRIT: 45 (ref 36–46)
HEMOGLOBIN: 15.1 (ref 12.0–16.0)
Neutrophils Absolute: 3
PLATELETS: 153 (ref 150–399)
WBC: 5.6

## 2018-07-29 LAB — TSH: TSH: 1.88 (ref 0.41–5.90)

## 2018-07-31 DIAGNOSIS — F0391 Unspecified dementia with behavioral disturbance: Secondary | ICD-10-CM | POA: Insufficient documentation

## 2018-07-31 DIAGNOSIS — F329 Major depressive disorder, single episode, unspecified: Secondary | ICD-10-CM

## 2018-07-31 DIAGNOSIS — F0151 Vascular dementia with behavioral disturbance: Secondary | ICD-10-CM | POA: Insufficient documentation

## 2018-07-31 DIAGNOSIS — F03918 Unspecified dementia, unspecified severity, with other behavioral disturbance: Secondary | ICD-10-CM | POA: Insufficient documentation

## 2018-07-31 DIAGNOSIS — F0393 Unspecified dementia, unspecified severity, with mood disturbance: Secondary | ICD-10-CM | POA: Insufficient documentation

## 2018-07-31 DIAGNOSIS — M329 Systemic lupus erythematosus, unspecified: Secondary | ICD-10-CM | POA: Insufficient documentation

## 2018-07-31 DIAGNOSIS — F0280 Dementia in other diseases classified elsewhere without behavioral disturbance: Secondary | ICD-10-CM | POA: Insufficient documentation

## 2018-07-31 DIAGNOSIS — F01518 Vascular dementia, unspecified severity, with other behavioral disturbance: Secondary | ICD-10-CM | POA: Insufficient documentation

## 2018-08-01 ENCOUNTER — Encounter: Payer: Self-pay | Admitting: Internal Medicine

## 2018-08-01 ENCOUNTER — Non-Acute Institutional Stay (SKILLED_NURSING_FACILITY): Payer: Medicare HMO | Admitting: Internal Medicine

## 2018-08-01 DIAGNOSIS — F0280 Dementia in other diseases classified elsewhere without behavioral disturbance: Secondary | ICD-10-CM

## 2018-08-01 DIAGNOSIS — F329 Major depressive disorder, single episode, unspecified: Secondary | ICD-10-CM

## 2018-08-01 DIAGNOSIS — M329 Systemic lupus erythematosus, unspecified: Secondary | ICD-10-CM

## 2018-08-01 DIAGNOSIS — F0391 Unspecified dementia with behavioral disturbance: Secondary | ICD-10-CM

## 2018-08-01 DIAGNOSIS — E034 Atrophy of thyroid (acquired): Secondary | ICD-10-CM | POA: Diagnosis not present

## 2018-08-01 DIAGNOSIS — F01518 Vascular dementia, unspecified severity, with other behavioral disturbance: Secondary | ICD-10-CM

## 2018-08-01 DIAGNOSIS — F03918 Unspecified dementia, unspecified severity, with other behavioral disturbance: Secondary | ICD-10-CM

## 2018-08-01 DIAGNOSIS — R296 Repeated falls: Secondary | ICD-10-CM

## 2018-08-01 DIAGNOSIS — R030 Elevated blood-pressure reading, without diagnosis of hypertension: Secondary | ICD-10-CM

## 2018-08-01 DIAGNOSIS — F0393 Unspecified dementia, unspecified severity, with mood disturbance: Secondary | ICD-10-CM

## 2018-08-01 DIAGNOSIS — F0151 Vascular dementia with behavioral disturbance: Secondary | ICD-10-CM

## 2018-08-01 NOTE — Progress Notes (Deleted)
Patient ID: Tamara Fisher, female   DOB: 08/27/1941, 77 y.o.   MRN: [id number]

## 2018-08-01 NOTE — Progress Notes (Deleted)
Error

## 2018-08-01 NOTE — Progress Notes (Signed)
Patient ID: Tamara Fisher, female   DOB: [birth year], 77 y.o.   MRN: [id number]    Provider:  DR Elmon Kirschner Location:  [facility] Room Number: 111 A Place of Service:  SNF (31)  PCP: Medicine, [facility] Patient Care Team: Medicine, [facility] as PCP - General (Family Medicine)  Extended Emergency Contact Information Primary Emergency Contact: [facility] Address: [address]          [place], [place] [address] Darden Amber of [place] Home Phone: [phone] Relation: Son Secondary Emergency Contact: Zareen, Jamison Mobile Phone: [phone] Relation: Relative  Code Status:  DNR Goals of Care: Advanced Directive information Advanced Directives 08/01/2018  Does Patient Have a Medical Advance Directive? Yes  Type of Advance Directive Out of facility DNR (pink MOST or yellow form)  Does patient want to make changes to medical advance directive? No - Patient declined  Copy of Healthcare Power of Attorney in Chart? -  Would patient like information on creating a medical advance directive? -  Pre-existing out of facility DNR order (yellow form or pink MOST form) Yellow form placed in chart (order not valid for inpatient use)      Chief Complaint  Patient presents with  . New Admit To SNF    Admission    HPI: Patient is a 77 y.o. female seen today for admission to SNF from ALF. Her son is at bedside. C/a whether she is still getting lorazepam for anxiety. She has had occasional agitation at night. She has a wander guard. She has HH aide at bedside to keep her company. Pt has no concerns. No falls since admission but does have a hx falls. Appetite ok and sleeps ok most nights. She is a poor historian due to dementia. Hx obtained from chart.  Hypothyroidism - stable on synthroid 75 mcg daily; TSH 1.88  Lupus - unsure if discoid vs systemic and no documentation in chart to further clarify. She takes tylenol 500 mg  twice daily for pain.   Depression with anxiety/agitation - related to dementia; stable on zoloft 100 mg daily; ativan 0.5 mg twice daily; melatonin 6 mg nightly   Psychosis - mood stable on zyprexa 5 mg twice daily and depakote 250 mg three times daily. Followed by facility psych service  Vascular dementia - unchanged. She gets agitated and also wanders. She wears a wander guard. She does not take any meds for cognition.   Vit D 25OH level 49.81 on 07/29/18  Unable to obtain surgical hx due to dementia.  Past Medical History:  Diagnosis Date  . Aneurysm of left carotid artery (HCC)   . Dementia (HCC)   . Hypothyroidism   . Lupus (HCC)    History reviewed. No pertinent surgical history.  reports that she has never smoked. She has never used smokeless tobacco. She reports that she does not drink alcohol or use drugs. Social History   Socioeconomic History  . Marital status: Divorced    Spouse name: Not on file  . Number of children: Not on file  . Years of education: Not on file  . Highest education level: Not on file  Occupational History  . Not on file  Social Needs  . Financial resource strain: Not on file  . Food insecurity:    Worry: Not on file    Inability: Not on file  . Transportation needs:    Medical: Not on file    Non-medical: Not on file  Tobacco  Use  . Smoking status: Never Smoker  . Smokeless tobacco: Never Used  Substance and Sexual Activity  . Alcohol use: No  . Drug use: No  . Sexual activity: Not Currently  Lifestyle  . Physical activity:    Days per week: Not on file    Minutes per session: Not on file  . Stress: Not on file  Relationships  . Social connections:    Talks on phone: Not on file    Gets together: Not on file    Attends religious service: Not on file    Active member of club or organization: Not on file    Attends meetings of clubs or organizations: Not on file    Relationship status: Not on file  . Intimate partner violence:     Fear of current or ex partner: Not on file    Emotionally abused: Not on file    Physically abused: Not on file    Forced sexual activity: Not on file  Other Topics Concern  . Not on file  Social History Narrative  . Not on file    Functional Status Survey:    Family History  Problem Relation Age of Onset  . Hypertension Other     Health Maintenance  Topic Date Due  . TETANUS/TDAP  08/29/2019 (Originally 02/14/1960)  . DEXA SCAN  09/01/2019 (Originally 02/15/2007)  . PNA vac Low Risk Adult (2 of 2 - PCV13) 09/01/2019 (Originally 06/29/2011)  . INFLUENZA VACCINE  Completed    Allergies  Allergen Reactions  . Tetracyclines & Related Nausea And Vomiting  . Sulfa Antibiotics Rash    Outpatient Encounter Medications as of 08/01/2018  Medication Sig  . acetaminophen (TYLENOL) 500 MG tablet Take 500 mg by mouth 2 (two) times daily. MAY ALSO TAKE AN EXTRA TABLET DAILY FOR BREAKTHROUGH PAIN  . cholecalciferol (VITAMIN D) 1000 units tablet Take 1,000 Units by mouth daily.  . divalproex (DEPAKOTE SPRINKLE) 125 MG capsule Take 250 mg by mouth 3 (three) times daily.   Marland Kitchen levothyroxine (SYNTHROID, LEVOTHROID) 75 MCG tablet Take 1 tablet (75 mcg total) by mouth daily before breakfast.  . LORazepam (ATIVAN) 0.5 MG tablet Take 1 tablet (0.5 mg total) by mouth 2 (two) times daily.  . Melatonin 3 MG TBDP Take 6 mg by mouth at bedtime.   . NON FORMULARY Diet type: Regular diet - regular texture  . OLANZapine (ZYPREXA) 10 MG tablet Take 5 mg by mouth 2 (two) times daily.   . ondansetron (ZOFRAN) 4 MG tablet Take 4 mg by mouth every 4 (four) hours as needed for nausea.   . sertraline (ZOLOFT) 100 MG tablet Take 100 mg by mouth daily.    No facility-administered encounter medications on file as of 08/01/2018.     Review of Systems  Unable to perform ROS: Dementia    Vitals:   08/01/18 1104  BP: (!) 148/76  Pulse: 71  Resp: 18  Temp: 98.1 F (36.7 C)  SpO2: 95%  Weight: 175 lb  12.8 oz (79.7 kg)  Height: 5\' 6"  (1.676 m)   Body mass index is 28.37 kg/m. Physical Exam  Constitutional: She appears well-developed and well-nourished.  Looks well in NAD, sitting in bedside chair  HENT:  Mouth/Throat: Oropharynx is clear and moist. No oropharyngeal exudate.  MMM; no oral thrush  Eyes: Pupils are equal, round, and reactive to light. No scleral icterus.  Neck: Neck supple. Carotid bruit is not present. No tracheal deviation present. No thyromegaly present.  Cardiovascular: Normal rate, regular rhythm and intact distal pulses. Exam reveals no gallop and no friction rub.  Murmur (1/6 SEM) heard. L>R +1 pitting LE edema; no calf TTP  Pulmonary/Chest: Effort normal and breath sounds normal. No stridor. No respiratory distress. She has no wheezes. She has no rales.  Abdominal: Soft. Normal appearance and bowel sounds are normal. She exhibits no distension and no mass. There is no hepatomegaly. There is no tenderness. There is no rigidity, no rebound and no guarding. No hernia.  obese  Musculoskeletal: She exhibits edema (small and large joints).  Lymphadenopathy:    She has no cervical adenopathy.  Neurological: She is alert. She has normal reflexes.  Skin: Skin is warm and dry. No rash noted.  Psychiatric: Her behavior is normal. She exhibits a depressed mood.    Labs reviewed: Basic Metabolic Panel: Recent Labs    08/22/17 0126 11/19/17 1205 07/25/18 07/29/18  NA 137 142 141 141  K 4.0 3.6 4.4 4.4  CL 104 104  --   --   CO2 24 28  --   --   GLUCOSE 119* 91  --   --   BUN 17 12 29* 28*  CREATININE 0.85 0.73 0.8 1.0  CALCIUM 8.5* 8.8*  --   --    Liver Function Tests: Recent Labs    08/22/17 0126 07/25/18 07/29/18  AST 30 49* 53*  ALT 26 25 33  ALKPHOS 76 117 115  BILITOT 0.5  --   --   PROT 6.2*  --   --   ALBUMIN 3.3*  --   --    No results for input(s): LIPASE, AMYLASE in the last 8760 hours. No results for input(s): AMMONIA in the last 8760  hours. CBC: Recent Labs    08/22/17 0126 11/19/17 1205 07/25/18 07/29/18  WBC 5.7 7.1 4.4 5.6  NEUTROABS  --  4.8 3 3   HGB 12.9 12.8 15.2 15.1  HCT 39.1 39.3 45 45  MCV 93.1 94.2  --   --   PLT 181 266 148* 153   Cardiac Enzymes: No results for input(s): CKTOTAL, CKMB, CKMBINDEX, TROPONINI in the last 8760 hours. BNP: Invalid input(s): POCBNP No results found for: HGBA1C Lab Results  Component Value Date   TSH 1.88 07/29/2018   No results found for: VITAMINB12 No results found for: FOLATE No results found for: IRON, TIBC, FERRITIN  Imaging and Procedures obtained prior to SNF admission: Ct Head Wo Contrast  Result Date: 05/20/2018 CLINICAL DATA:  Head trauma, minor, GCS>=13, high clinical risk, initial exam. Unwitnessed fall. EXAM: CT HEAD WITHOUT CONTRAST TECHNIQUE: Contiguous axial images were obtained from the base of the skull through the vertex without intravenous contrast. COMPARISON:  Head CT 11/19/2017 FINDINGS: Brain: Similar degree of atrophy and chronic small vessel ischemia from prior exam. 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 No intracranial hemorrhage, mass effect, or midline shift. No hydrocephalus. The basilar cisterns are patent. No evidence of territorial infarct or acute ischemia. No extra-axial or intracranial fluid collection. Vascular: Atherosclerosis of skullbase vasculature without hyperdense vessel or abnormal calcification. Skull: No fracture or focal lesion. Sinuses/Orbits: Paranasal sinuses and mastoid air cells are clear. The visualized orbits are unremarkable. Other: Left frontoparietal scalp hematoma. IMPRESSION: 1. Left scalp hematoma. No acute intracranial abnormality. No skull fracture. 2. Unchanged atrophy and chronic small vessel ischemia. Electronically Signed   By: Rubye Oaks M.D.   On: 05/20/2018 21:22    Assessment/Plan  ICD-10-CM   1. Elevated BP without diagnosis of hypertension R03.0   2.  Psychosis in elderly with behavioral disturbance (HCC) F03.91   3. Depression due to dementia (HCC) F32.9    F02.80   4. Hypothyroidism due to acquired atrophy of thyroid E03.4   5. Vascular dementia with behavior disturbance (HCC) F01.51   6. Lupus (HCC) M32.9   7. Falls frequently R29.6     Cont current meds as ordered  PT/OT/ST as ordered  Follow BP - if consistently >140/90 t/c antihypertensive  Psych services to follow  Fall precautions  GOAL: short term rehab then long term care. Communicated with pt and nursing.  Will follow  Labs/tests ordered: none    Mily Malecki S. Ancil Linsey  [facility] [address] [place], [place] [address] [phone] Cell (Monday-Friday 8 AM - 5 PM) [phone] After 5 PM and follow prompts

## 2018-08-11 ENCOUNTER — Emergency Department (HOSPITAL_COMMUNITY): Payer: Medicare HMO

## 2018-08-11 ENCOUNTER — Encounter (HOSPITAL_COMMUNITY): Payer: Self-pay | Admitting: Emergency Medicine

## 2018-08-11 ENCOUNTER — Other Ambulatory Visit: Payer: Self-pay

## 2018-08-11 ENCOUNTER — Emergency Department (HOSPITAL_COMMUNITY)
Admission: EM | Admit: 2018-08-11 | Discharge: 2018-08-11 | Disposition: A | Discharge status: Home / Self Care | Payer: Medicare HMO | Attending: Emergency Medicine | Admitting: Emergency Medicine

## 2018-08-11 DIAGNOSIS — Z23 Encounter for immunization: Secondary | ICD-10-CM | POA: Diagnosis not present

## 2018-08-11 DIAGNOSIS — E039 Hypothyroidism, unspecified: Secondary | ICD-10-CM | POA: Insufficient documentation

## 2018-08-11 DIAGNOSIS — R531 Weakness: Secondary | ICD-10-CM | POA: Insufficient documentation

## 2018-08-11 DIAGNOSIS — Y92129 Unspecified place in nursing home as the place of occurrence of the external cause: Secondary | ICD-10-CM | POA: Insufficient documentation

## 2018-08-11 DIAGNOSIS — W1830XA Fall on same level, unspecified, initial encounter: Secondary | ICD-10-CM | POA: Diagnosis not present

## 2018-08-11 DIAGNOSIS — I2510 Atherosclerotic heart disease of native coronary artery without angina pectoris: Secondary | ICD-10-CM | POA: Insufficient documentation

## 2018-08-11 DIAGNOSIS — S0181XA Laceration without foreign body of other part of head, initial encounter: Secondary | ICD-10-CM | POA: Diagnosis not present

## 2018-08-11 DIAGNOSIS — F0390 Unspecified dementia without behavioral disturbance: Secondary | ICD-10-CM | POA: Diagnosis not present

## 2018-08-11 DIAGNOSIS — Z79899 Other long term (current) drug therapy: Secondary | ICD-10-CM | POA: Insufficient documentation

## 2018-08-11 DIAGNOSIS — Y999 Unspecified external cause status: Secondary | ICD-10-CM | POA: Insufficient documentation

## 2018-08-11 DIAGNOSIS — S0990XA Unspecified injury of head, initial encounter: Secondary | ICD-10-CM | POA: Diagnosis present

## 2018-08-11 DIAGNOSIS — Y939 Activity, unspecified: Secondary | ICD-10-CM | POA: Insufficient documentation

## 2018-08-11 LAB — BASIC METABOLIC PANEL
ANION GAP: 9 (ref 5–15)
BUN: 31 mg/dL — ABNORMAL HIGH (ref 8–23)
CALCIUM: 8.6 mg/dL — AB (ref 8.9–10.3)
CO2: 30 mmol/L (ref 22–32)
Chloride: 104 mmol/L (ref 98–111)
Creatinine, Ser: 0.87 mg/dL (ref 0.44–1.00)
GFR calc Af Amer: >60 mL/min (ref >60)
GLUCOSE: 87 mg/dL (ref 70–99)
Potassium: 4.1 mmol/L (ref 3.5–5.1)
Sodium: 143 mmol/L (ref 135–145)

## 2018-08-11 LAB — CBC
HCT: 44 % (ref 36.0–46.0)
Hemoglobin: 14.2 g/dL (ref 12.0–15.0)
MCH: 33.3 pg (ref 26.0–34.0)
MCHC: 32.3 g/dL (ref 30.0–36.0)
MCV: 103 fL — AB (ref 80.0–100.0)
NRBC: 0 % (ref 0.0–0.2)
PLATELETS: 141 10*3/uL — AB (ref 150–400)
RBC: 4.27 MIL/uL (ref 3.87–5.11)
RDW: 13.4 % (ref 11.5–15.5)
WBC: 4.5 10*3/uL (ref 4.0–10.5)

## 2018-08-11 MED ORDER — TETANUS-DIPHTH-ACELL PERTUSSIS 5-2.5-18.5 LF-MCG/0.5 IM SUSP
0.5000 mL | Freq: Once | INTRAMUSCULAR | Status: AC
  Administered 2018-08-11: 0.5 mL via INTRAMUSCULAR
  Filled 2018-08-11: qty 0.5

## 2018-08-11 NOTE — ED Notes (Signed)
Bed: [facility] Expected date:  Expected time:  Means of arrival:  Comments: 77 yo fall

## 2018-08-11 NOTE — Discharge Instructions (Signed)
Follow-up with your primary care doctor, return as needed for recurrent episodes,

## 2018-08-11 NOTE — ED Triage Notes (Signed)
Patient BIB [facility] from [place] for witnessed fall. Pt attempted to get up unassisted without her walker. No LOC, pt is not on bld thinners. Pt sustained small laceration to left temple. C-collar placed by EMS. Pt has hx of dementia, alert to self and follows commands.

## 2018-08-11 NOTE — ED Provider Notes (Signed)
[facility]-EMERGENCY DEPT Provider Note   CSN: [id number] Arrival date & time: 08/11/18  1044     History   Chief Complaint Chief Complaint  Patient presents with  . Fall  . Head Laceration    HPI Tamara Fisher is a 77 y.o. female.  HPI Patient presents to the emergency room for evaluation after a fall.  Patient has a history of dementia.  She resides at a nursing facility, [facility].  Patient attempted to get up and walk without her walker.  Staff heard her fall.  Patient was noted to have a laceration around her left temple.  Patient does not know what happened and cannot tell me.  She denies any complaints. Past Medical History:  Diagnosis Date  . Aneurysm of left carotid artery (HCC)   . Dementia (HCC)   . Hypothyroidism   . Lupus [facility])     Patient Active Problem List   Diagnosis Date Noted  . Lupus (HCC) 07/31/2018  . 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 Fall 08/22/2017  . Hypothyroidism 08/22/2017  . Carotid artery disease (HCC) 08/22/2017    History reviewed. No pertinent surgical history.   OB History   None      Home Medications    Prior to Admission medications   Medication Sig Start Date End Date Taking? Authorizing Provider  acetaminophen (TYLENOL) 500 MG tablet Take 500 mg by mouth 2 (two) times daily. MAY ALSO TAKE AN EXTRA TABLET DAILY FOR BREAKTHROUGH PAIN    [provider]  cholecalciferol (VITAMIN D) 1000 units tablet Take 1,000 Units by mouth daily.    [provider]  divalproex (DEPAKOTE SPRINKLE) 125 MG capsule Take 250 mg by mouth 3 (three) times daily.     [provider]  levothyroxine (SYNTHROID, LEVOTHROID) 75 MCG tablet Take 1 tablet (75 mcg total) by mouth daily before breakfast. 08/23/17   Short, Thea Silversmith, MD  LORazepam (ATIVAN) 0.5  MG tablet Take 1 tablet (0.5 mg total) by mouth 2 (two) times daily. 07/25/18   Sharee Holster, NP  Melatonin 3 MG TBDP Take 6 mg by mouth at bedtime.     [provider]  NON FORMULARY Diet type: Regular diet - regular texture    [provider]  OLANZapine (ZYPREXA) 10 MG tablet Take 5 mg by mouth 2 (two) times daily.     [provider]  ondansetron (ZOFRAN) 4 MG tablet Take 4 mg by mouth every 4 (four) hours as needed for nausea.     [provider]  sertraline (ZOLOFT) 100 MG tablet Take 100 mg by mouth daily.     [provider]    Family History Family History  Problem Relation Age of Onset  . Hypertension Other     Social History Social History   Tobacco Use  . Smoking status: Never Smoker  . Smokeless tobacco: Never Used  Substance Use Topics  . Alcohol use: No  . Drug use: No     Allergies   Tetracyclines & related and Sulfa antibiotics   Review of Systems Review of Systems  All other systems reviewed and are negative.    Physical Exam Updated Vital Signs BP 134/79   Pulse (!) 59   Temp 97.7 F (36.5 C) (Oral)   Resp 18   SpO2 97%   Physical Exam  Constitutional: She  appears listless. No distress.  Elderly, frail  HENT:  Head: Normocephalic.  Right Ear: External ear normal.  Left Ear: External ear normal.  Approximately 1 cm laceration left temple area, no active bleeding  Eyes: Conjunctivae are normal. Right eye exhibits no discharge. Left eye exhibits no discharge. No scleral icterus.  Neck: Neck supple. No tracheal deviation present.  Cardiovascular: Normal rate, regular rhythm and intact distal pulses.  Pulmonary/Chest: Effort normal and breath sounds normal. No stridor. No respiratory distress. She has no wheezes. She has no rales.  Abdominal: Soft. Bowel sounds are normal. She exhibits no distension. There is no tenderness. There is no rebound and no guarding.  Musculoskeletal: She exhibits no  edema or tenderness.  Neurological: She appears listless. No cranial nerve deficit (no facial droop, extraocular movements intact, no slurred speech) or sensory deficit. She exhibits normal muscle tone. She displays no seizure activity. Coordination normal. GCS eye subscore is 4. GCS verbal subscore is 4. GCS motor subscore is 6.  Patient is able to lift both legs off the bed, good grip strength bilaterally, slow to answer questions  Skin: Skin is warm. No rash noted. She is not diaphoretic.  Psychiatric: She has a normal mood and affect.  Nursing note and vitals reviewed.    ED Treatments / Results  Labs (all labs ordered are listed, but only abnormal results are displayed) Labs Reviewed  CBC - Abnormal; Notable for the following components:      Result Value   MCV 103.0 (*)    Platelets 141 (*)    All other components within normal limits  BASIC METABOLIC PANEL - Abnormal; Notable for the following components:   BUN 31 (*)    Calcium 8.6 (*)    All other components within normal limits    EKG EKG Interpretation  Date/Time:  Sunday August 11 2018 11:30:37 EST Ventricular Rate:  64 PR Interval:  178 QRS Duration: 92 QT Interval:  458 QTC Calculation: 472 R Axis:   -51 Text Interpretation:  Normal sinus rhythm Left anterior fascicular block Possible Anterolateral infarct , age undetermined Abnormal ECG No significant change since last tracing Confirmed by Linwood Dibbles [phone]) on 08/11/2018 11:45:06 AM Also confirmed by Linwood Dibbles [phone]), editor Barbette Hair [phone])  on 08/11/2018 2:32:30 PM   Radiology Dg Chest 2 View  Result Date: 08/11/2018 CLINICAL DATA:  Fall.  Weakness. EXAM: CHEST - 2 VIEW COMPARISON:  11/19/2017 FINDINGS: Lung volumes are low. There is opacity at the bases consistent with atelectasis in combination with prominent bronchovascular markings. No convincing pneumonia and no pulmonary edema. Cardiac silhouette is mildly enlarged. No mediastinal or hilar  masses. No pleural effusion or pneumothorax. Skeletal structures are grossly intact. IMPRESSION: No acute cardiopulmonary disease. Electronically Signed   By: Amie Portland M.D.   On: 08/11/2018 12:15   Ct Head Wo Contrast  Result Date: 08/11/2018 CLINICAL DATA:  Patient with fall. EXAM: CT HEAD WITHOUT CONTRAST TECHNIQUE: Contiguous axial images were obtained from the base of the skull through the vertex without intravenous contrast. COMPARISON:  Brain CT 05/20/2018 FINDINGS: Brain: Ventricles and sulci are appropriate for patient's age. No evidence for acute cortically based infarct, intracranial hemorrhage, mass lesion or mass-effect. Unchanged chronic basal ganglia lacunar infarcts. Vascular: Unremarkable Skull: Intact. Sinuses/Orbits: Paranasal sinuses are well aerated. Mastoid air cells are unremarkable. Orbits are unremarkable. Other: None. IMPRESSION: No acute intracranial process. Atrophy and chronic microvascular ischemic changes. Electronically Signed   By: Annia Belt M.D.   On:  08/11/2018 12:07   Ct Cervical Spine Wo Contrast  Result Date: 08/11/2018 CLINICAL DATA:  Unsteady gait. Unwitnessed fall. EXAM: CT CERVICAL SPINE WITHOUT CONTRAST TECHNIQUE: Multidetector CT imaging of the cervical spine was performed without intravenous contrast. Multiplanar CT image reconstructions were also generated. COMPARISON:  None. FINDINGS: Alignment: Normal anatomic alignment. Skull base and vertebrae: No acute fracture. No primary bone lesion or focal pathologic process. Probable left vertebral ectasia accounting for prominent foramina along the left side. Soft tissues and spinal canal: No prevertebral fluid or swelling. No visible canal hematoma. 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 Upper chest: Negative. Other: None IMPRESSION: No evidence of acute traumatic injury to the cervical spine. Degenerative changes. Electronically Signed   By: Annia Belt M.D.   On: 08/11/2018 13:16     Procedures .Marland KitchenLaceration Repair Date/Time: 08/11/2018 2:39 PM Performed by: Linwood Dibbles, MD Authorized by: Linwood Dibbles, MD   Consent:    Consent obtained:  Verbal   Consent given by:  Patient   Risks discussed:  Infection, need for additional repair, pain, poor cosmetic result and poor wound healing   Alternatives discussed:  No treatment and delayed treatment Universal protocol:    Procedure explained and questions answered to patient or proxy's satisfaction: yes     Relevant documents present and verified: yes     Test results available and properly labeled: yes     Imaging studies available: yes     Required blood products, implants, devices, and special equipment available: yes     Site/side marked: yes     Immediately prior to procedure, a time out was called: yes     Patient identity confirmed:  Verbally with patient Anesthesia (see MAR for exact dosages):    Anesthesia method:  Local infiltration Laceration details:    Location:  Face   Facial location: left temple.   Length (cm):  1 Repair type:    Repair type:  Simple Treatment:    Area cleansed with:  Saline   Amount of cleaning:  Standard Skin repair:    Repair method:  Tissue adhesive Approximation:    Approximation:  Close   (including critical care time)  Medications Ordered in ED Medications  Tdap (BOOSTRIX) injection 0.5 mL (0.5 mLs Intramuscular Given 08/11/18 1334)     Initial Impression / Assessment and Plan / ED Course  I have reviewed the triage vital signs and the nursing notes.  Pertinent labs & imaging results that were available during my care of the patient were reviewed by me and considered in my medical decision making (see chart for details).   Patient presented to the emergency room for evaluation of an unwitnessed fall.  Patient is post use a walker and she did not have it with her.  Patient does not have any focal neurologic deficits on exam.  ED work-up is reassuring.  She had a  small laceration that I applied Dermabond to.  At this time there does not appear to be any evidence of an acute emergency medical condition and the patient appears stable for discharge with appropriate outpatient follow up.  Final Clinical Impressions(s) / ED Diagnoses   Final diagnoses:  Facial laceration, initial encounter    ED Discharge Orders    None       Linwood Dibbles, MD 08/11/18 1440

## 2018-08-11 NOTE — ED Notes (Signed)
Patient discharged in care of her son and daughter-in-law, who will transport her back to her facility. Pt assisted into wheelchair and into sons car. Patients yellow DNR, and AVS are with pts son, who states he will give to facility .

## 2018-09-11 ENCOUNTER — Encounter (HOSPITAL_COMMUNITY): Payer: Self-pay

## 2018-09-11 ENCOUNTER — Emergency Department (HOSPITAL_COMMUNITY)
Admission: EM | Admit: 2018-09-11 | Discharge: 2018-09-12 | Disposition: A | Discharge status: Home / Self Care | Payer: Medicare HMO | Attending: Emergency Medicine | Admitting: Emergency Medicine

## 2018-09-11 DIAGNOSIS — S0083XA Contusion of other part of head, initial encounter: Secondary | ICD-10-CM

## 2018-09-11 DIAGNOSIS — Y939 Activity, unspecified: Secondary | ICD-10-CM | POA: Diagnosis not present

## 2018-09-11 DIAGNOSIS — F0390 Unspecified dementia without behavioral disturbance: Secondary | ICD-10-CM | POA: Diagnosis not present

## 2018-09-11 DIAGNOSIS — E039 Hypothyroidism, unspecified: Secondary | ICD-10-CM | POA: Insufficient documentation

## 2018-09-11 DIAGNOSIS — Y999 Unspecified external cause status: Secondary | ICD-10-CM | POA: Insufficient documentation

## 2018-09-11 DIAGNOSIS — F05 Delirium due to known physiological condition: Secondary | ICD-10-CM | POA: Diagnosis not present

## 2018-09-11 DIAGNOSIS — S0990XA Unspecified injury of head, initial encounter: Secondary | ICD-10-CM | POA: Diagnosis present

## 2018-09-11 DIAGNOSIS — Z79899 Other long term (current) drug therapy: Secondary | ICD-10-CM | POA: Diagnosis not present

## 2018-09-11 DIAGNOSIS — W1830XA Fall on same level, unspecified, initial encounter: Secondary | ICD-10-CM | POA: Diagnosis not present

## 2018-09-11 DIAGNOSIS — Y92129 Unspecified place in nursing home as the place of occurrence of the external cause: Secondary | ICD-10-CM | POA: Insufficient documentation

## 2018-09-11 DIAGNOSIS — W19XXXA Unspecified fall, initial encounter: Secondary | ICD-10-CM

## 2018-09-11 NOTE — ED Triage Notes (Signed)
Pt had an unwitnessed ground level fall, no LOC no complaints, pt was in wheelchair when EMS arrived Pt has no obvious injuries

## 2018-09-11 NOTE — ED Notes (Signed)
Bed: [id number] Expected date:  Expected time:  Means of arrival:  Comments: EMS fall, alzheimers

## 2018-09-11 NOTE — ED Notes (Signed)
[place] [facility] reported to have had an unwitnessed fall. On examination pt denies pain or injury. Palpation of all extremities and head produced no pain response and Mrs Andrey CampanileWilson denies pain or injury.

## 2018-09-12 ENCOUNTER — Emergency Department (HOSPITAL_COMMUNITY): Payer: Medicare HMO

## 2018-09-12 NOTE — ED Notes (Signed)
[facility] contacted for transport report [facility]

## 2018-09-12 NOTE — ED Provider Notes (Signed)
[facility]-EMERGENCY DEPT Provider Note: [name], MD, FACEP  CSN: [id number] MRN: [id number] ARRIVAL: 09/11/18 at 2334 ROOM: WA14/WA14   CHIEF COMPLAINT  Fall  Level 5 caveat: Dementia HISTORY OF PRESENT ILLNESS  09/12/18 12:14 AM Tamara Fisher is a 77 y.o. female who had an unwitnessed ground-level fall earlier.  She has no complaints.  She is unable to tell me what happened.  She has a hematoma to her left forehead.  She is not on anticoagulation.    Past Medical History:  Diagnosis Date  . Aneurysm of left carotid artery (HCC)   . Dementia (HCC)   . Hypothyroidism   . Lupus (HCC)     History reviewed. No pertinent surgical history.  Family History  Problem Relation Age of Onset  . Hypertension Other     Social History   Tobacco Use  . Smoking status: Never Smoker  . Smokeless tobacco: Never Used  Substance Use Topics  . Alcohol use: No  . Drug use: No    Prior to Admission medications   Medication Sig Start Date End Date Taking? Authorizing Provider  acetaminophen (TYLENOL) 500 MG tablet Take 500 mg by mouth 2 (two) times daily. MAY ALSO TAKE AN EXTRA TABLET DAILY FOR BREAKTHROUGH PAIN   Yes [provider]  cholecalciferol (VITAMIN D) 1000 units tablet Take 1,000 Units by mouth daily.   Yes [provider]  divalproex (DEPAKOTE SPRINKLE) 125 MG capsule Take 250 mg by mouth 3 (three) times daily.    Yes [provider]  levothyroxine (SYNTHROID, LEVOTHROID) 75 MCG tablet Take 1 tablet (75 mcg total) by mouth daily before breakfast. 08/23/17  Yes Short, Thea Silversmith, MD  LORazepam (ATIVAN) 0.5 MG tablet Take 1 tablet (0.5 mg total) by mouth 2 (two) times daily. 07/25/18  Yes Sharee Holster, NP  Melatonin 3 MG TBDP Take 6 mg by mouth at bedtime.    Yes [provider]  OLANZapine (ZYPREXA) 10 MG tablet Take 5 mg by mouth 2 (two) times daily.    Yes [provider]  ondansetron (ZOFRAN) 4 MG tablet Take 4 mg by mouth  every 4 (four) hours as needed for nausea.    Yes [provider]  sertraline (ZOLOFT) 100 MG tablet Take 100 mg by mouth daily.    Yes [provider]  NON FORMULARY Diet type: Regular diet - regular texture    [provider]    Allergies Tetracyclines & related and Sulfa antibiotics   REVIEW OF SYSTEMS     PHYSICAL EXAMINATION  Initial Vital Signs Blood pressure (!) 153/80, pulse 82, temperature 97.7 F (36.5 C), temperature source Oral, resp. rate 13, SpO2 94 %.  Examination General: Well-developed, well-nourished female in no acute distress; appearance consistent with age of record HENT: normocephalic; left forehead hematoma; no hemotympanum Eyes: pupils equal, round and reactive to light; extraocular muscles intact Neck: supple; nontender Heart: regular rate and rhythm Lungs: clear to auscultation bilaterally Abdomen: soft; nondistended; nontender; bowel sounds present Back: No spinal tenderness Extremities: No deformity; edema of lower legs bilaterally; no pain on passive range of motion Neurologic: Awake, alert, confused; motor function intact in all extremities and symmetric; no facial droop Skin: Warm and dry Psychiatric: Normal mood and affect   RESULTS  Summary of this visit's results, reviewed by myself:   EKG Interpretation  Date/Time:    Ventricular Rate:    PR Interval:    QRS Duration:   QT Interval:    QTC Calculation:  R Axis:     Text Interpretation:        Laboratory Studies: No results found for this or any previous visit (from the past 24 hour(s)). Imaging Studies: Ct Head Wo Contrast  Result Date: 09/12/2018 CLINICAL DATA:  77 year old female with head trauma. EXAM: CT HEAD WITHOUT CONTRAST TECHNIQUE: Contiguous axial images were obtained from the base of the skull through the vertex without intravenous contrast. COMPARISON:  Head CT dated 08/11/2018 FINDINGS: Brain: There is mild age-related atrophy and  chronic microvascular ischemic changes. Small areas of old infarct in the basal ganglia or anterior limb of the internal capsule bilaterally noted. There is no acute intracranial hemorrhage. No mass effect or midline shift noted. No extra-axial fluid collection. Vascular: No hyperdense vessel or unexpected calcification. Skull: Normal. Negative for fracture or focal lesion. Sinuses/Orbits: No acute finding. Other: Left forehead contusion. IMPRESSION: 1. No acute intracranial hemorrhage. 2. Mild age-related atrophy and chronic microvascular ischemic changes. Electronically Signed   By: Elgie CollardArash  Radparvar M.D.   On: 09/12/2018 01:27    ED COURSE and MDM  Nursing notes and initial vitals signs, including pulse oximetry, reviewed.  Vitals:   09/11/18 2339 09/11/18 2340  BP: (!) 153/80   Pulse: 82   Resp: 13   Temp: 97.7 F (36.5 C)   TempSrc: Oral   SpO2: 94% 94%   3:07 AM Patient confused consistent with sundowning.  CT negative for acute intracranial injury.  PROCEDURES    ED DIAGNOSES     ICD-10-CM   1. Fall at nursing home, initial encounter W19.XXXA    Y92.129   2. Traumatic hematoma of forehead, initial encounter S00.83XA   3. Sundowning F05        Ryu Cerreta, MD 09/12/18 [phone]

## 2018-10-17 ENCOUNTER — Emergency Department (HOSPITAL_COMMUNITY): Payer: Medicare HMO

## 2018-10-17 ENCOUNTER — Emergency Department (HOSPITAL_COMMUNITY)
Admission: EM | Admit: 2018-10-17 | Discharge: 2018-10-17 | Disposition: A | Discharge status: Home / Self Care | Payer: Medicare HMO | Attending: Emergency Medicine | Admitting: Emergency Medicine

## 2018-10-17 DIAGNOSIS — W19XXXA Unspecified fall, initial encounter: Secondary | ICD-10-CM

## 2018-10-17 DIAGNOSIS — F0151 Vascular dementia with behavioral disturbance: Secondary | ICD-10-CM | POA: Diagnosis not present

## 2018-10-17 DIAGNOSIS — N390 Urinary tract infection, site not specified: Secondary | ICD-10-CM | POA: Diagnosis not present

## 2018-10-17 DIAGNOSIS — R29810 Facial weakness: Secondary | ICD-10-CM | POA: Diagnosis present

## 2018-10-17 DIAGNOSIS — Y939 Activity, unspecified: Secondary | ICD-10-CM | POA: Diagnosis not present

## 2018-10-17 DIAGNOSIS — Y999 Unspecified external cause status: Secondary | ICD-10-CM | POA: Diagnosis not present

## 2018-10-17 DIAGNOSIS — E039 Hypothyroidism, unspecified: Secondary | ICD-10-CM | POA: Diagnosis not present

## 2018-10-17 DIAGNOSIS — Z79899 Other long term (current) drug therapy: Secondary | ICD-10-CM | POA: Insufficient documentation

## 2018-10-17 DIAGNOSIS — Y92129 Unspecified place in nursing home as the place of occurrence of the external cause: Secondary | ICD-10-CM | POA: Insufficient documentation

## 2018-10-17 DIAGNOSIS — W1830XA Fall on same level, unspecified, initial encounter: Secondary | ICD-10-CM | POA: Diagnosis not present

## 2018-10-17 LAB — CBC WITH DIFFERENTIAL/PLATELET
ABS IMMATURE GRANULOCYTES: 0.02 10*3/uL (ref 0.00–0.07)
BASOS PCT: 1 %
Basophils Absolute: 0 10*3/uL (ref 0.0–0.1)
Eosinophils Absolute: 0.2 10*3/uL (ref 0.0–0.5)
Eosinophils Relative: 5 %
HCT: 44.5 % (ref 36.0–46.0)
HEMOGLOBIN: 14 g/dL (ref 12.0–15.0)
IMMATURE GRANULOCYTES: 1 %
LYMPHS PCT: 30 %
Lymphs Abs: 1.3 10*3/uL (ref 0.7–4.0)
MCH: 31.8 pg (ref 26.0–34.0)
MCHC: 31.5 g/dL (ref 30.0–36.0)
MCV: 101.1 fL — ABNORMAL HIGH (ref 80.0–100.0)
MONOS PCT: 17 %
Monocytes Absolute: 0.7 10*3/uL (ref 0.1–1.0)
NEUTROS ABS: 2.1 10*3/uL (ref 1.7–7.7)
NEUTROS PCT: 46 %
Platelets: 148 10*3/uL — ABNORMAL LOW (ref 150–400)
RBC: 4.4 MIL/uL (ref 3.87–5.11)
RDW: 12.2 % (ref 11.5–15.5)
WBC: 4.4 10*3/uL (ref 4.0–10.5)
nRBC: 0 % (ref 0.0–0.2)

## 2018-10-17 LAB — COMPREHENSIVE METABOLIC PANEL
ALBUMIN: 3.1 g/dL — AB (ref 3.5–5.0)
ALK PHOS: 91 U/L (ref 38–126)
ALT: 11 U/L (ref 0–44)
AST: 23 U/L (ref 15–41)
Anion gap: 7 (ref 5–15)
BILIRUBIN TOTAL: 0.5 mg/dL (ref 0.3–1.2)
BUN: 22 mg/dL (ref 8–23)
CALCIUM: 9.1 mg/dL (ref 8.9–10.3)
CO2: 28 mmol/L (ref 22–32)
Chloride: 106 mmol/L (ref 98–111)
Creatinine, Ser: 1.01 mg/dL — ABNORMAL HIGH (ref 0.44–1.00)
GFR calc Af Amer: >60 mL/min (ref >60)
GFR calc non Af Amer: 54 mL/min — ABNORMAL LOW (ref >60)
GLUCOSE: 102 mg/dL — AB (ref 70–99)
Potassium: 4.1 mmol/L (ref 3.5–5.1)
Sodium: 141 mmol/L (ref 135–145)
TOTAL PROTEIN: 6.2 g/dL — AB (ref 6.5–8.1)

## 2018-10-17 LAB — URINALYSIS, ROUTINE W REFLEX MICROSCOPIC
BILIRUBIN URINE: NEGATIVE
GLUCOSE, UA: NEGATIVE mg/dL
HGB URINE DIPSTICK: NEGATIVE
KETONES UR: NEGATIVE mg/dL
Nitrite: NEGATIVE
PH: 5 (ref 5.0–8.0)
Protein, ur: NEGATIVE mg/dL
Specific Gravity, Urine: 1.024 (ref 1.005–1.030)

## 2018-10-17 LAB — AMMONIA: AMMONIA: 30 umol/L (ref 9–35)

## 2018-10-17 LAB — I-STAT TROPONIN, ED: Troponin i, poc: 0 ng/mL (ref 0.00–0.08)

## 2018-10-17 LAB — I-STAT CG4 LACTIC ACID, ED: LACTIC ACID, VENOUS: 1.85 mmol/L (ref 0.5–1.9)

## 2018-10-17 LAB — CK: Total CK: 34 U/L — ABNORMAL LOW (ref 38–234)

## 2018-10-17 LAB — CBG MONITORING, ED: GLUCOSE-CAPILLARY: 90 mg/dL (ref 70–99)

## 2018-10-17 MED ORDER — CEPHALEXIN 500 MG PO CAPS: 500.0000 mg | ORAL_CAPSULE | Freq: Two times a day (BID) | ORAL | 0 refills | Status: AC

## 2018-10-17 NOTE — ED Notes (Signed)
Discharge paperwork reviewed with patient son at bedside.

## 2018-10-17 NOTE — ED Provider Notes (Signed)
[facility] Note   CSN: [id number] Arrival date & time: 10/17/18  0413     History   Chief Complaint Chief Complaint  Patient presents with  . Fall  . Facial Droop    HPI Tamara Fisher is a 78 y.o. female BIB EMS for fall. There is a level 5 caveat due to dementia. Hx is obtained for nursing triage. Patient apparently had an unwitnessed fall at her nursing facility. Staff believe she hit her head . She is not on anticoagulation medications. EMS reported a left facial droop and sluggish left pupil. Last known normal was 2 days ago at 7:00 PM.  HPI  Past Medical History:  Diagnosis Date  . Aneurysm of left carotid artery (HCC)   . Dementia (HCC)   . Hypothyroidism   . Lupus [facility](HCC)     Patient Active Problem List   Diagnosis Date Noted  . Lupus (HCC) 07/31/2018  . 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 Fall 08/22/2017  . Hypothyroidism 08/22/2017  . Carotid artery disease (HCC) 08/22/2017    No past surgical history on file.   OB History   No obstetric history on file.      Home Medications    Prior to Admission medications   Medication Sig Start Date End Date Taking? Authorizing Provider  acetaminophen (TYLENOL) 500 MG tablet Take 500 mg by mouth 2 (two) times daily. MAY ALSO TAKE AN EXTRA TABLET DAILY FOR BREAKTHROUGH PAIN    [provider]  cholecalciferol (VITAMIN D) 1000 units tablet Take 1,000 Units by mouth daily.    [provider]  divalproex (DEPAKOTE SPRINKLE) 125 MG capsule Take 250 mg by mouth 3 (three) times daily.     [provider]  levothyroxine (SYNTHROID, LEVOTHROID) 75 MCG tablet Take 1 tablet (75 mcg total) by mouth daily before breakfast. 08/23/17   Short, Thea SilversmithMackenzie, MD  LORazepam (ATIVAN) 0.5 MG tablet Take 1 tablet (0.5 mg  total) by mouth 2 (two) times daily. 07/25/18   Sharee HolsterGreen, Deborah S, NP  Melatonin 3 MG TBDP Take 6 mg by mouth at bedtime.     [provider]  NON FORMULARY Diet type: Regular diet - regular texture    [provider]  OLANZapine (ZYPREXA) 10 MG tablet Take 5 mg by mouth 2 (two) times daily.     [provider]  ondansetron (ZOFRAN) 4 MG tablet Take 4 mg by mouth every 4 (four) hours as needed for nausea.     [provider]  sertraline (ZOLOFT) 100 MG tablet Take 100 mg by mouth daily.     [provider]    Family History Family History  Problem Relation Age of Onset  . Hypertension Other     Social History Social History   Tobacco Use  . Smoking status: Never Smoker  . Smokeless tobacco: Never Used  Substance Use Topics  . Alcohol use: No  . Drug use: No     Allergies   Tetracyclines & related and Sulfa antibiotics   Review of Systems Review of Systems  Unable to perform ROS: Dementia     Physical Exam Updated Vital Signs BP 108/66   Pulse 72   Temp (!) 97.5 F (36.4 C) (Oral)   Resp 20   SpO2 94%   Physical Exam Vitals signs and nursing note reviewed.  Constitutional:      General: She is not in acute distress.    Appearance: She is well-developed and normal weight. She is not ill-appearing, toxic-appearing or diaphoretic.     Interventions: Cervical collar in place.     Comments: Somnolent (0440 AM)  HENT:     Head: Normocephalic and atraumatic.     Right Ear: Tympanic membrane normal.     Left Ear: Tympanic membrane normal.     Nose: Nose normal.     Mouth/Throat:     Mouth: Mucous membranes are moist.  Eyes:     General: No scleral icterus.    Extraocular Movements: Extraocular movements intact.     Conjunctiva/sclera: Conjunctivae normal.     Pupils: Pupils are equal, round, and reactive to light.  Neck:     Musculoskeletal: Normal range of motion.  Cardiovascular:     Rate and Rhythm: Normal rate  and regular rhythm.     Heart sounds: Normal heart sounds. No murmur. No friction rub. No gallop.   Pulmonary:     Effort: Pulmonary effort is normal. No respiratory distress.     Breath sounds: Normal breath sounds.  Abdominal:     General: Bowel sounds are normal. There is no distension.     Palpations: Abdomen is soft. There is no mass.     Tenderness: There is no abdominal tenderness. There is no guarding.  Skin:    General: Skin is warm and dry.  Neurological:     General: No focal deficit present.     Mental Status: She is disoriented.     Cranial Nerves: No cranial nerve deficit.     Motor: No weakness.     Deep Tendon Reflexes: Reflexes normal.     Comments: No obvious facial droop, no disconjugate gaze or abnormal pupillary response.  Psychiatric:        Behavior: Behavior normal.      ED Treatments / Results  Labs (all labs ordered are listed, but only abnormal results are displayed) Labs Reviewed - No data to display  EKG EKG Interpretation  Date/Time:  Thursday October 17 2018 04:18:13 EST Ventricular Rate:  72 PR Interval:    QRS Duration: 102 QT Interval:  442 QTC Calculation: 484 R Axis:   -66 Text Interpretation:  Sinus rhythm Left anterior fascicular block Abnormal R-wave progression, late transition Borderline T abnormalities, anterior leads No significant change was found Confirmed by Glynn Octave [id number]) on 10/17/2018 4:48:41 AM   Radiology No results found.  Procedures Procedures (including critical care time)  Medications Ordered in ED Medications - No data to display   Initial Impression / Assessment and Plan / ED Course  I have reviewed the triage vital signs and the nursing notes.  Pertinent labs & imaging results that were available during my care of the patient were reviewed by me and considered in my medical decision making (see chart for details).     78 year old female with unwitnessed fall.  There is no obvious evidence of  neurologic deficit.  CK is normal with no evidence of rhabdomyolysis.  Labs are otherwise without significant abnormality.  CT head and C-spine show no acute injury.  I personally reviewed the images and agree with radiologic interpretation.  Patient's urine is currently pending.  I have given sign out to [facility].  Expect discharge home to skilled nursing facility.  Final Clinical Impressions(s) / ED Diagnoses   Final diagnoses:  None    ED Discharge Orders  None       Arthor Captain, PA-C 10/17/18 [id number]    Nira Conn, MD 10/17/18 [phone]

## 2018-10-17 NOTE — ED Triage Notes (Signed)
Pt coming by EMS after having unwitnessed fall at facility. Pt was alert upon facility finding her. Pt hit head but no visible trauma noted. Pt not on blood thinners. Upon arriving EMS and facility reported left facial droop and left pupil sluggish. Last known well was Tuesday at 1900.

## 2018-10-17 NOTE — ED Notes (Signed)
Nazyah Todhunter (pt's son) [phone]

## 2018-10-17 NOTE — ED Provider Notes (Signed)
Care handoff received from Arthor Captain, PA-C at shift change, please see her note for full details of visit.  In short 78 year old with history of dementia presenting from skilled nursing facility following unwitnessed fall.  Additionally patient with reported left-sided facial droop, last seen normal greater than 48 hours prior.  Patient without facial droop or neuro deficits on arrival.  No history of anticoagulation.  Lab work overall unremarkable.  CT head and cervical spine without acute abnormalities.  Plan at handoff is to await urinalysis and treat accordingly then discharged back to facility. Physical Exam  BP 115/71   Pulse 69   Temp (!) 97.5 F (36.4 C) (Oral)   Resp 16   SpO2 97%   Physical Exam Constitutional:      General: She is not in acute distress.    Appearance: Normal appearance. She is well-developed. She is not ill-appearing or diaphoretic.  HENT:     Head: Normocephalic and atraumatic. No raccoon eyes, Battle's sign, abrasion or contusion.     Right Ear: External ear normal.     Left Ear: External ear normal.     Nose: Nose normal.     Mouth/Throat:     Lips: Pink.     Mouth: Mucous membranes are moist.     Pharynx: Oropharynx is clear. Uvula midline.  Eyes:     General: Vision grossly intact. Gaze aligned appropriately.     Extraocular Movements: Extraocular movements intact.     Conjunctiva/sclera: Conjunctivae normal.     Pupils: Pupils are equal, round, and reactive to light.  Neck:     Musculoskeletal: Full passive range of motion without pain, normal range of motion and neck supple. No spinous process tenderness or muscular tenderness.     Trachea: Trachea and phonation normal. No tracheal deviation.  Cardiovascular:     Rate and Rhythm: Normal rate and regular rhythm.     Pulses:          Dorsalis pedis pulses are 2+ on the right side and 2+ on the left side.       Posterior tibial pulses are 2+ on the right side and 2+ on the left side.   Pulmonary:     Effort: Pulmonary effort is normal. No respiratory distress.     Breath sounds: Normal breath sounds and air entry.  Chest:     Chest wall: No tenderness.  Abdominal:     Palpations: Abdomen is soft.     Tenderness: There is no abdominal tenderness. There is no guarding or rebound.  Musculoskeletal: Normal range of motion.     Right lower leg: Normal.     Left lower leg: Normal.     Comments: No midline C/T/L spinal tenderness to palpation, no paraspinal muscle tenderness, no deformity, crepitus, or step-off noted. No sign of injury to the neck or back.  No tenderness to compression of the hips.  Hips stable bilaterally.  Patient is able to actively bring her knees towards chest bilaterally without pain.  Feet:     Right foot:     Protective Sensation: 3 sites tested. 3 sites sensed.     Left foot:     Protective Sensation: 3 sites tested. 3 sites sensed.  Skin:    General: Skin is warm and dry.  Neurological:     Mental Status: She is alert and oriented to person, place, and time.     GCS: GCS eye subscore is 4. GCS verbal subscore is 5. GCS motor subscore  is 6.     Comments: Mental Status: Alert, follows simple commands without difficulty. Speech fluent without evidence of aphasia. Cranial Nerves: II: Peripheral visual fields grossly normal, pupils equal, round, reactive to light III,IV, VI: ptosis not present, extra-ocular motions intact bilaterally V,VII: smile symmetric, eyebrows raise symmetric, facial light touch sensation equal VIII: hearing grossly normal to voice X: uvula elevates symmetrically XI: bilateral shoulder shrug symmetric and strong XII: midline tongue extension without fassiculations Motor: Normal tone. 5/5 strength in upper and lower extremities bilaterally including strong and equal grip strength and dorsiflexion/plantar flexion Sensory: Sensation intact to light touch in all extremities. CV: distal pulses palpable throughout   Psychiatric:        Behavior: Behavior normal.    ED Course/Procedures     Procedures  MDM  CBG 90 Troponin negative Lactic acid 1.85 Ammonia 30 CK 34 CMP nonacute CBC nonacute CT head/cervical spine:  IMPRESSION:  1. Chronic ischemic microangiopathy without acute intracranial  abnormality.  2. No acute fracture or static subluxation of the cervical spine.   Physical examination overall reassuring.  No focal neuro deficits on my exam.  Patient denying any pain on my examination.  On my initial evaluation patient's son Barbara CowerJason is at bedside.  He states that he does not see any facial droop on patient and that she is responding to him at baseline. ------------ Urinalysis suggestive of urinary tract infection.  Urine culture sent.  Creatinine clearance calculated with Cockcroft-Gault equation, patient has been prescribed Keflex 500 mg twice daily for 7 days. Patient's son at bedside informed of UTI and antibiotic. ----------- At discharge patient is afebrile, not tachycardic, not hypotensive, well-appearing and in no acute distress.  Patient denying any and all pain at discharge and she has been discharged in good condition.  At this time there does not appear to be any evidence of an acute emergency medical condition and the patient appears stable for discharge with appropriate outpatient follow up. Diagnosis was discussed with patient and son who verbalizes understanding of care plan and is agreeable to discharge. I have discussed return precautions with patient and son who verbalize understanding of return precautions. Patient and son strongly encouraged to follow-up with their PCP this week. All questions answered.  Patient was seen and evaluated by Dr. Eudelia Bunchardama during this visit who agrees with discharge back to facility.  Note: Portions of this report may have been transcribed using voice recognition software. Every effort was made to ensure accuracy; however, inadvertent  computerized transcription errors may still be present.   Elky PalauMorelli, Zayra Devito A, PA-C 10/17/18 [phone]    Nira Connardama, Pedro Eduardo, MD 10/17/18 2330

## 2018-10-17 NOTE — ED Notes (Signed)
CBG 90 notified RN

## 2018-10-17 NOTE — Discharge Instructions (Addendum)
You have been diagnosed today with Fall and urinary tract infection.  At this time there does not appear to be the presence of an emergent medical condition, however there is always the potential for conditions to change. Please read and follow the below instructions.  Please return to the Emergency Department immediately for any new or worsening symptoms. Please be sure to follow up with your Primary Care Provider this week regarding your visit today; please call their office to schedule an appointment even if you are feeling better for a follow-up visit. Please take the medication Keflex as prescribed to help with urinary tract infection.  The patient's urine has been sent for culture and if a different antibiotic is necessary patient will be contacted.  Get help right away if: You have: A very bad (severe) headache that is not helped by medicine. Trouble walking or weakness in your arms and legs. Clear or bloody fluid coming from your nose or ears. Changes in your seeing (vision). Jerky movements that you cannot control (seizure). You throw up (vomit). Your symptoms get worse. You lose balance. Your speech is slurred. You pass out. You are sleepier and have trouble staying awake. The black centers of your eyes (pupils) change in size. Get help right away if you have: Severe pain in your back or your lower abdomen. A fever. Nausea or vomiting.  Please read the additional information packets attached to your discharge summary.  Do not take your medicine if  develop an itchy rash, swelling in your mouth or lips, or difficulty breathing.

## 2018-10-18 LAB — URINE CULTURE

## 2018-10-29 ENCOUNTER — Other Ambulatory Visit: Payer: Self-pay

## 2018-10-29 ENCOUNTER — Emergency Department (HOSPITAL_COMMUNITY)
Admission: EM | Admit: 2018-10-29 | Discharge: 2018-10-29 | Disposition: A | Discharge status: Home / Self Care | Payer: Medicare HMO | Attending: Emergency Medicine | Admitting: Emergency Medicine

## 2018-10-29 ENCOUNTER — Encounter (HOSPITAL_COMMUNITY): Payer: Self-pay | Admitting: Emergency Medicine

## 2018-10-29 DIAGNOSIS — E039 Hypothyroidism, unspecified: Secondary | ICD-10-CM | POA: Insufficient documentation

## 2018-10-29 DIAGNOSIS — I2510 Atherosclerotic heart disease of native coronary artery without angina pectoris: Secondary | ICD-10-CM | POA: Insufficient documentation

## 2018-10-29 DIAGNOSIS — F0390 Unspecified dementia without behavioral disturbance: Secondary | ICD-10-CM | POA: Insufficient documentation

## 2018-10-29 DIAGNOSIS — Z043 Encounter for examination and observation following other accident: Secondary | ICD-10-CM | POA: Diagnosis not present

## 2018-10-29 DIAGNOSIS — F329 Major depressive disorder, single episode, unspecified: Secondary | ICD-10-CM | POA: Diagnosis not present

## 2018-10-29 DIAGNOSIS — W19XXXA Unspecified fall, initial encounter: Secondary | ICD-10-CM

## 2018-10-29 DIAGNOSIS — Z79899 Other long term (current) drug therapy: Secondary | ICD-10-CM | POA: Insufficient documentation

## 2018-10-29 DIAGNOSIS — W0110XA Fall on same level from slipping, tripping and stumbling with subsequent striking against unspecified object, initial encounter: Secondary | ICD-10-CM | POA: Diagnosis not present

## 2018-10-29 NOTE — ED Provider Notes (Signed)
[facility]-EMERGENCY DEPT Provider Note   CSN: [id number] Arrival date & time: 10/29/18  0636     History   Chief Complaint Chief Complaint  Patient presents with  . Fall    HPI Tamara Fisher is a 78 y.o. female.  HPI Elderly female presents after a fall at her nursing facility. The patient does have dementia, but seems to answer questions about her current status appropriately. She denies any complaints, and when asked specifically how we can help her, if she is any discomfort, denies any discomfort, states that she is unsure how we can be of assistance to her. Reported the patient had a fall, striking the back of her head. Loss of consciousness is uncertain. No reported change in interactivity following the fall, and the patient currently denies any weakness, vision changes, pain. Level 5 caveat secondary to dementia.   Past Medical History:  Diagnosis Date  . Aneurysm of left carotid artery (HCC)   . Dementia (HCC)   . Hypothyroidism   . Lupus [facility](HCC)     Patient Active Problem List   Diagnosis Date Noted  . Lupus (HCC) 07/31/2018  . 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 Fall 08/22/2017  . Hypothyroidism 08/22/2017  . Carotid artery disease (HCC) 08/22/2017    History reviewed. No pertinent surgical history.   OB History   No obstetric history on file.      Home Medications    Prior to Admission medications   Medication Sig Start Date End Date Taking? Authorizing Provider  acetaminophen (TYLENOL) 500 MG tablet Take 500 mg by mouth 2 (two) times daily. MAY ALSO TAKE AN EXTRA TABLET DAILY FOR BREAKTHROUGH PAIN    [provider]  cholecalciferol (VITAMIN D) 1000 units tablet Take 1,000 Units by mouth daily.    [provider]  divalproex (DEPAKOTE SPRINKLE) 125 MG capsule  Take 250 mg by mouth 3 (three) times daily.     [provider]  guaiFENesin (ROBITUSSIN) 100 MG/5ML SOLN Take 20 mLs by mouth every 6 (six) hours as needed for cough or to loosen phlegm.    [provider]  levothyroxine (SYNTHROID, LEVOTHROID) 75 MCG tablet Take 1 tablet (75 mcg total) by mouth daily before breakfast. 08/23/17   Short, Thea SilversmithMackenzie, MD  LORazepam (ATIVAN) 0.5 MG tablet Take 1 tablet (0.5 mg total) by mouth 2 (two) times daily. 07/25/18   Sharee HolsterGreen, Deborah S, NP  Melatonin 3 MG TBDP Take 6 mg by mouth at bedtime.     [provider]  NON FORMULARY Diet type: Regular diet - regular texture    [provider]  OLANZapine (ZYPREXA) 10 MG tablet Take 5 mg by mouth 2 (two) times daily.     [provider]  ondansetron (ZOFRAN) 4 MG tablet Take 4 mg by mouth every 4 (four) hours as needed for nausea.     [provider]  sertraline (ZOLOFT) 100 MG tablet Take 100 mg by mouth daily.     [provider]    Family History Family History  Problem Relation Age of Onset  . Hypertension Other     Social History Social History   Tobacco Use  . Smoking status: Never Smoker  . Smokeless tobacco: Never Used  Substance Use Topics  . Alcohol use: No  . Drug use: No     Allergies  Tetracyclines & related and Sulfa antibiotics   Review of Systems Review of Systems  Unable to perform ROS: Dementia     Physical Exam Updated Vital Signs SpO2 94%   Physical Exam Vitals signs and nursing note reviewed.  Constitutional:      General: She is not in acute distress.    Appearance: She is well-developed.     Comments: Elderly female with obvious dementia, but pleasant, answering questions about her current status, seemingly appropriately, denying complaints she is in no distress, is awake, alert, sitting upright  HENT:     Head: Normocephalic and atraumatic.  Eyes:     Conjunctiva/sclera: Conjunctivae normal.  Neck:      Musculoskeletal: No spinous process tenderness or muscular tenderness.     Comments: Patient moves her neck spontaneously throughout the initial evaluation, has no pain with palpation, has no pain with range of motion exercises, has no deformities, no crepitus Cardiovascular:     Rate and Rhythm: Normal rate and regular rhythm.  Pulmonary:     Effort: Pulmonary effort is normal. No respiratory distress.     Breath sounds: Normal breath sounds. No stridor.  Abdominal:     General: There is no distension.  Skin:    General: Skin is warm and dry.  Neurological:     Mental Status: She is alert.     Cranial Nerves: No cranial nerve deficit.     Motor: Atrophy present. No tremor, abnormal muscle tone or pronator drift.     Coordination: Coordination normal.      ED Treatments / Results  Labs (all labs ordered are listed, but only abnormal results are displayed) Labs Reviewed - No data to display  EKG None  Radiology No results found.  Procedures Procedures (including critical care time)  Medications Ordered in ED Medications - No data to display   Initial Impression / Assessment and Plan / ED Course  I have reviewed the triage vital signs and the nursing notes.  Pertinent labs & imaging results that were available during my care of the patient were reviewed by me and considered in my medical decision making (see chart for details).  Elderly female presents after a fall at her nursing facility. Patient's dementia limits some of her ability to provide details of the event, but currently she denies any complaints, has no deformities, no neurologic dysfunction, seemingly interacting at baseline, is hemodynamically unremarkable. Patient is not on blood thinning medication, after I reviewed her paperwork.  Absent complaints, absent neurologic dysfunction, or abnormal neurologic findings on exam, patient is appropriate for return to her nursing facility.   Final Clinical  Impressions(s) / ED Diagnoses   Final diagnoses:  Fall, initial encounter     Gerhard Munch, MD 10/29/18 [phone]

## 2018-10-29 NOTE — Discharge Instructions (Signed)
As discussed, your evaluation today has been largely reassuring.  But, it is important that you monitor your condition carefully, and do not hesitate to return to the ED if you develop new, or concerning changes in your condition. ? ?Otherwise, please follow-up with your physician for appropriate ongoing care. ? ?

## 2018-10-29 NOTE — ED Triage Notes (Signed)
Patient presents from [place] with complaints of a fall. Fall was witnessed. Patient attempted to get up on her own. She fell forward and hit her head. No bleeding. No use of blood thinners and no LOC. Patient complaining of a headache.

## 2018-10-29 NOTE — ED Notes (Signed)
Attempted to call [facility] to give report, no answer.

## 2018-10-29 NOTE — ED Notes (Signed)
Bed: WA22 Expected date:  Expected time:  Means of arrival:  Comments: 

## 2018-10-29 NOTE — ED Notes (Signed)
[facility] called  

## 2018-11-20 ENCOUNTER — Emergency Department (HOSPITAL_COMMUNITY)
Admission: EM | Admit: 2018-11-20 | Discharge: 2018-11-20 | Disposition: A | Discharge status: Home / Self Care | Payer: Medicare HMO | Attending: Emergency Medicine | Admitting: Emergency Medicine

## 2018-11-20 ENCOUNTER — Other Ambulatory Visit: Payer: Self-pay

## 2018-11-20 ENCOUNTER — Emergency Department (HOSPITAL_COMMUNITY): Payer: Medicare HMO

## 2018-11-20 ENCOUNTER — Encounter (HOSPITAL_COMMUNITY): Payer: Self-pay | Admitting: Emergency Medicine

## 2018-11-20 DIAGNOSIS — Z79899 Other long term (current) drug therapy: Secondary | ICD-10-CM | POA: Diagnosis not present

## 2018-11-20 DIAGNOSIS — F0390 Unspecified dementia without behavioral disturbance: Secondary | ICD-10-CM | POA: Insufficient documentation

## 2018-11-20 DIAGNOSIS — Y939 Activity, unspecified: Secondary | ICD-10-CM | POA: Diagnosis not present

## 2018-11-20 DIAGNOSIS — Y999 Unspecified external cause status: Secondary | ICD-10-CM | POA: Insufficient documentation

## 2018-11-20 DIAGNOSIS — W06XXXA Fall from bed, initial encounter: Secondary | ICD-10-CM | POA: Diagnosis not present

## 2018-11-20 DIAGNOSIS — I2510 Atherosclerotic heart disease of native coronary artery without angina pectoris: Secondary | ICD-10-CM | POA: Diagnosis not present

## 2018-11-20 DIAGNOSIS — Y92122 Bedroom in nursing home as the place of occurrence of the external cause: Secondary | ICD-10-CM | POA: Diagnosis not present

## 2018-11-20 DIAGNOSIS — S0003XA Contusion of scalp, initial encounter: Secondary | ICD-10-CM

## 2018-11-20 DIAGNOSIS — E039 Hypothyroidism, unspecified: Secondary | ICD-10-CM | POA: Diagnosis not present

## 2018-11-20 DIAGNOSIS — S0990XA Unspecified injury of head, initial encounter: Secondary | ICD-10-CM | POA: Diagnosis present

## 2018-11-20 DIAGNOSIS — W19XXXA Unspecified fall, initial encounter: Secondary | ICD-10-CM

## 2018-11-20 LAB — CBG MONITORING, ED: Glucose-Capillary: 74 mg/dL (ref 70–99)

## 2018-11-20 NOTE — ED Notes (Signed)
Called [facility] made aware of transport needed back to [place].

## 2018-11-20 NOTE — Discharge Instructions (Signed)
Return to ER if you have any vomiting, severe headache, confusion, lethargy, or new areas of pain from your fall.

## 2018-11-20 NOTE — ED Notes (Signed)
Called [place] to give report, number online isnt working at this time.

## 2018-11-20 NOTE — ED Provider Notes (Signed)
[facility]   CSN: [id number] Arrival date & time: 11/20/18  2482    History   Chief Complaint Chief Complaint  Patient presents with  . Fall    HPI Tamara Fisher is a 78 y.o. female.     77 year old female with past medical history below including dementia, lupus, hypothyroidism who presents with fall.  Nursing facility reported to EMS that patient had unwitnessed fall from bed this morning.  They noted a hematoma to the posterior head.  She has had no bleeding.  Patient denies any complaints.  Nursing facility reported no anticoagulant use.  LEVEL 5 CAVEAT DUE TO DEMENTIA  The history is provided by the EMS personnel and the nursing home.  Fall     Past Medical History:  Diagnosis Date  . Aneurysm of left carotid artery (HCC)   . Dementia (HCC)   . Hypothyroidism   . Lupus [facility])     Patient Active Problem List   Diagnosis Date Noted  . Lupus (HCC) 07/31/2018  . 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 Fall 08/22/2017  . Hypothyroidism 08/22/2017  . Carotid artery disease (HCC) 08/22/2017    History reviewed. No pertinent surgical history.   OB History   No obstetric history on file.      Home Medications    Prior to Admission medications   Medication Sig Start Date End Date Taking? Authorizing Provider  acetaminophen (TYLENOL) 500 MG tablet Take 500 mg by mouth 2 (two) times daily. MAY ALSO TAKE AN EXTRA TABLET DAILY FOR BREAKTHROUGH PAIN   Yes [provider]  cholecalciferol (VITAMIN D) 1000 units tablet Take 1,000 Units by mouth daily.   Yes [provider]  divalproex (DEPAKOTE SPRINKLE) 125 MG capsule Take 250 mg by mouth 3 (three) times daily.    Yes [provider]  guaiFENesin (ROBITUSSIN) 100 MG/5ML SOLN Take 20 mLs by mouth  every 6 (six) hours as needed for cough or to loosen phlegm.   Yes [provider]  levothyroxine (SYNTHROID, LEVOTHROID) 75 MCG tablet Take 1 tablet (75 mcg total) by mouth daily before breakfast. 08/23/17  Yes Short, Thea Silversmith, MD  LORazepam (ATIVAN) 0.5 MG tablet Take 1 tablet (0.5 mg total) by mouth 2 (two) times daily. Patient taking differently: Take 0.25 mg by mouth 2 (two) times daily.  07/25/18  Yes Sharee Holster, NP  Melatonin 3 MG TBDP Take 6 mg by mouth at bedtime.    Yes [provider]  OLANZapine (ZYPREXA) 10 MG tablet Take 10 mg by mouth at bedtime.    Yes [provider]  ondansetron (ZOFRAN) 4 MG tablet Take 4 mg by mouth every 4 (four) hours as needed for nausea.    Yes [provider]  sertraline (ZOLOFT) 100 MG tablet Take 100 mg by mouth daily.    Yes [provider]  traZODone (DESYREL) 50 MG tablet Take 25 mg by mouth at bedtime.   Yes [provider]  NON FORMULARY Diet type: Regular diet - regular texture    [provider]    Family History Family History  Problem Relation Age of Onset  . Hypertension Other     Social History Social History   Tobacco Use  . Smoking status: Never Smoker  . Smokeless tobacco: Never Used  Substance Use Topics  . Alcohol use: No  .  Drug use: No     Allergies   Tetracyclines & related and Sulfa antibiotics   Review of Systems Review of Systems  Unable to perform ROS: Dementia     Physical Exam Updated Vital Signs BP 97/65   Pulse 67   Temp 97.6 F (36.4 C) (Oral)   Resp 19   SpO2 91%   Physical Exam Vitals signs and nursing note reviewed.  Constitutional:      General: She is not in acute distress.    Appearance: She is well-developed.  HENT:     Head:     Comments: Tender hematoma right posterior scalp, no lacerations    Nose: Nose normal.     Mouth/Throat:     Comments: Dry mouth and lips Eyes:     Extraocular Movements: Extraocular  movements intact.     Conjunctiva/sclera: Conjunctivae normal.     Pupils: Pupils are equal, round, and reactive to light.  Neck:     Comments: In c-collar Cardiovascular:     Rate and Rhythm: Normal rate and regular rhythm.     Heart sounds: Normal heart sounds. No murmur.  Pulmonary:     Effort: Pulmonary effort is normal.     Breath sounds: Normal breath sounds.  Chest:     Chest wall: No tenderness.  Abdominal:     General: Bowel sounds are normal. There is no distension.     Palpations: Abdomen is soft.     Tenderness: There is no abdominal tenderness.  Musculoskeletal: Normal range of motion.        General: No tenderness or deformity.     Comments: Mild LLE edema compared to R, non-tender with no skin changes  Skin:    General: Skin is warm and dry.     Findings: No erythema or rash.  Neurological:     Mental Status: She is alert.     Comments: Non-sensical speech, able to follow a few basic commands, 3/5 strength BLE, 4/5 strength BUE      ED Treatments / Results  Labs (all labs ordered are listed, but only abnormal results are displayed) Labs Reviewed  CBG MONITORING, ED    EKG None  Radiology Ct Head Wo Contrast  Result Date: 11/20/2018 CLINICAL DATA:  Fall from bed EXAM: CT HEAD WITHOUT CONTRAST CT CERVICAL SPINE WITHOUT CONTRAST TECHNIQUE: Multidetector CT imaging of the head and cervical spine was performed following the standard protocol without intravenous contrast. Multiplanar CT image reconstructions of the cervical spine were also generated. COMPARISON:  10/17/2018 FINDINGS: CT HEAD FINDINGS Brain: No evidence of acute infarction, hemorrhage, hydrocephalus, extra-axial collection or mass lesion/mass effect. Periventricular white matter hypodensity. Vascular: No hyperdense vessel or unexpected calcification. Skull: Normal. Negative for fracture or focal lesion. Sinuses/Orbits: No acute finding. Other: None. CT CERVICAL SPINE FINDINGS Alignment: Normal.  Skull base and vertebrae: No acute fracture. No primary bone lesion or focal pathologic process. Soft tissues and spinal canal: No prevertebral fluid or swelling. No visible canal hematoma. Disc levels: Mild multilevel disc space height loss and osteophytosis. Upper chest: Negative. Other: None. IMPRESSION: 1. No acute intracranial pathology. Small-vessel white matter disease. 2.  No fracture or static subluxation of the cervical spine. Electronically Signed   By: Lauralyn PrimesAlex  Bibbey M.D.   On: 11/20/2018 10:10   Ct Cervical Spine Wo Contrast  Result Date: 11/20/2018 CLINICAL DATA:  Fall from bed EXAM: CT HEAD WITHOUT CONTRAST CT CERVICAL SPINE WITHOUT CONTRAST TECHNIQUE: Multidetector CT imaging of the head and cervical  spine was performed following the standard protocol without intravenous contrast. Multiplanar CT image reconstructions of the cervical spine were also generated. COMPARISON:  10/17/2018 FINDINGS: CT HEAD FINDINGS Brain: No evidence of acute infarction, hemorrhage, hydrocephalus, extra-axial collection or mass lesion/mass effect. Periventricular white matter hypodensity. Vascular: No hyperdense vessel or unexpected calcification. Skull: Normal. Negative for fracture or focal lesion. Sinuses/Orbits: No acute finding. Other: None. CT CERVICAL SPINE FINDINGS Alignment: Normal. Skull base and vertebrae: No acute fracture. No primary bone lesion or focal pathologic process. Soft tissues and spinal canal: No prevertebral fluid or swelling. No visible canal hematoma. Disc levels: Mild multilevel disc space height loss and osteophytosis. Upper chest: Negative. Other: None. IMPRESSION: 1. No acute intracranial pathology. Small-vessel white matter disease. 2.  No fracture or static subluxation of the cervical spine. Electronically Signed   By: Lauralyn Primes M.D.   On: 11/20/2018 10:10    Procedures Procedures (including critical care time)  Medications Ordered in ED Medications - No data to  display   Initial Impression / Assessment and Plan / ED Course  I have reviewed the triage vital signs and the nursing notes.  Pertinent labs & imaging results that were available during my care of the patient were reviewed by me and considered in my medical decision making (see chart for details).       Comfortable on exam, reassuring VS. Hematoma scalp noted with no other areas of tenderness.  CT head and C-spine reassuring.  Patient remains alert and well-appearing on reassessment.  Discharged back to nursing facility.  Final Clinical Impressions(s) / ED Diagnoses   Final diagnoses:  Fall, initial encounter  Contusion of scalp, initial encounter    ED Discharge Orders    None       Shanai Lartigue, Ambrose Finland, MD 11/20/18 1540

## 2018-11-20 NOTE — ED Notes (Signed)
Bed: WA03 Expected date:  Expected time:  Means of arrival:  Comments: EMS_fall 

## 2018-11-20 NOTE — ED Triage Notes (Signed)
Per [facility] pt from [place] for unwitnessed fall from bed. Pt has hematoma to posterior head, no bleeding and no blood thinners. Pt has c-collar on and in place. Pt has dementia and at her baseline.  Vitals: 110/70, 72HR, 16R, CBG 97

## 2019-08-22 IMAGING — CR DG CHEST 2V
2 series · 2 of 2 positions shown · non-contrast
Comparison: 11/19/2017

CLINICAL DATA: Fall.  Weakness.

EXAM:
CHEST - 2 VIEW

[w chest lat]
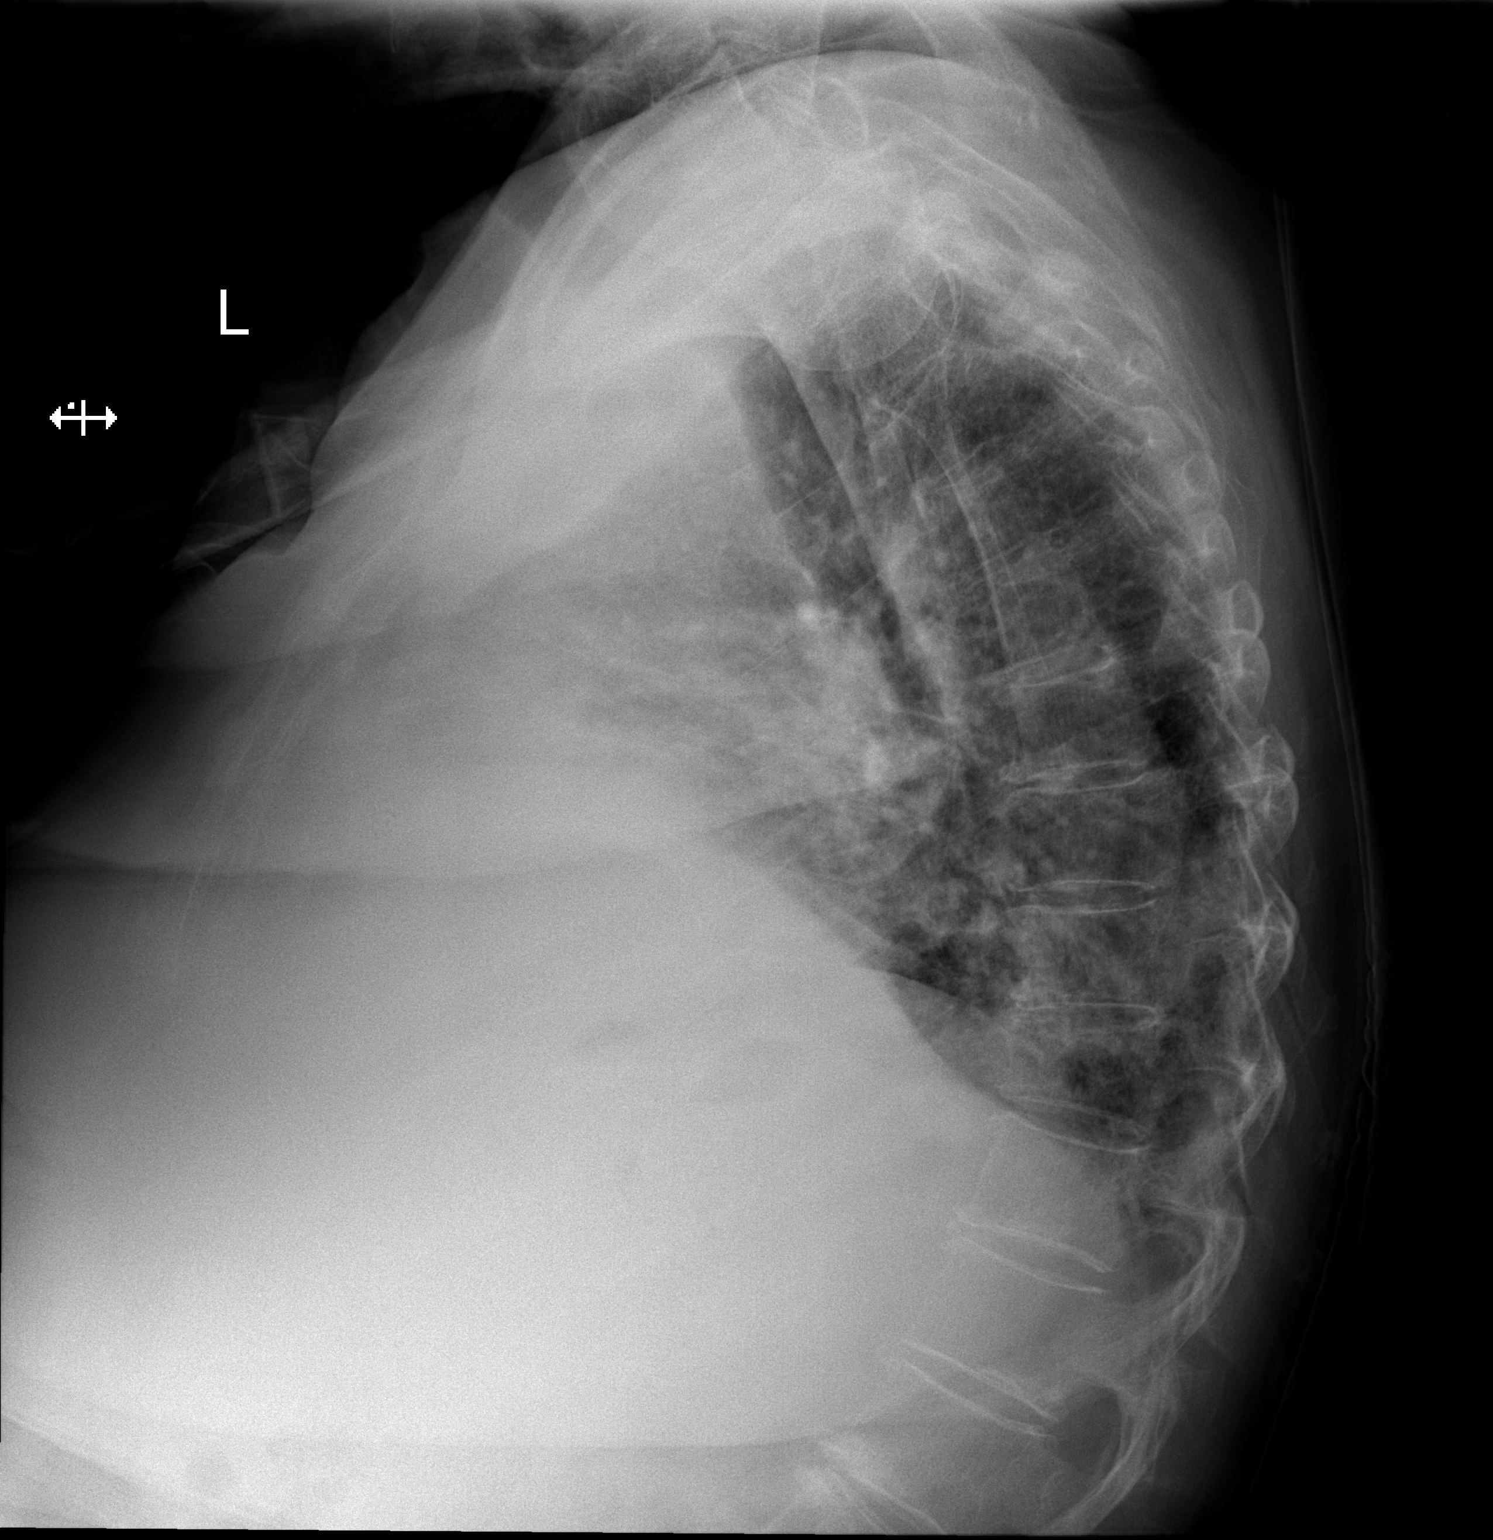

[x chest ap]
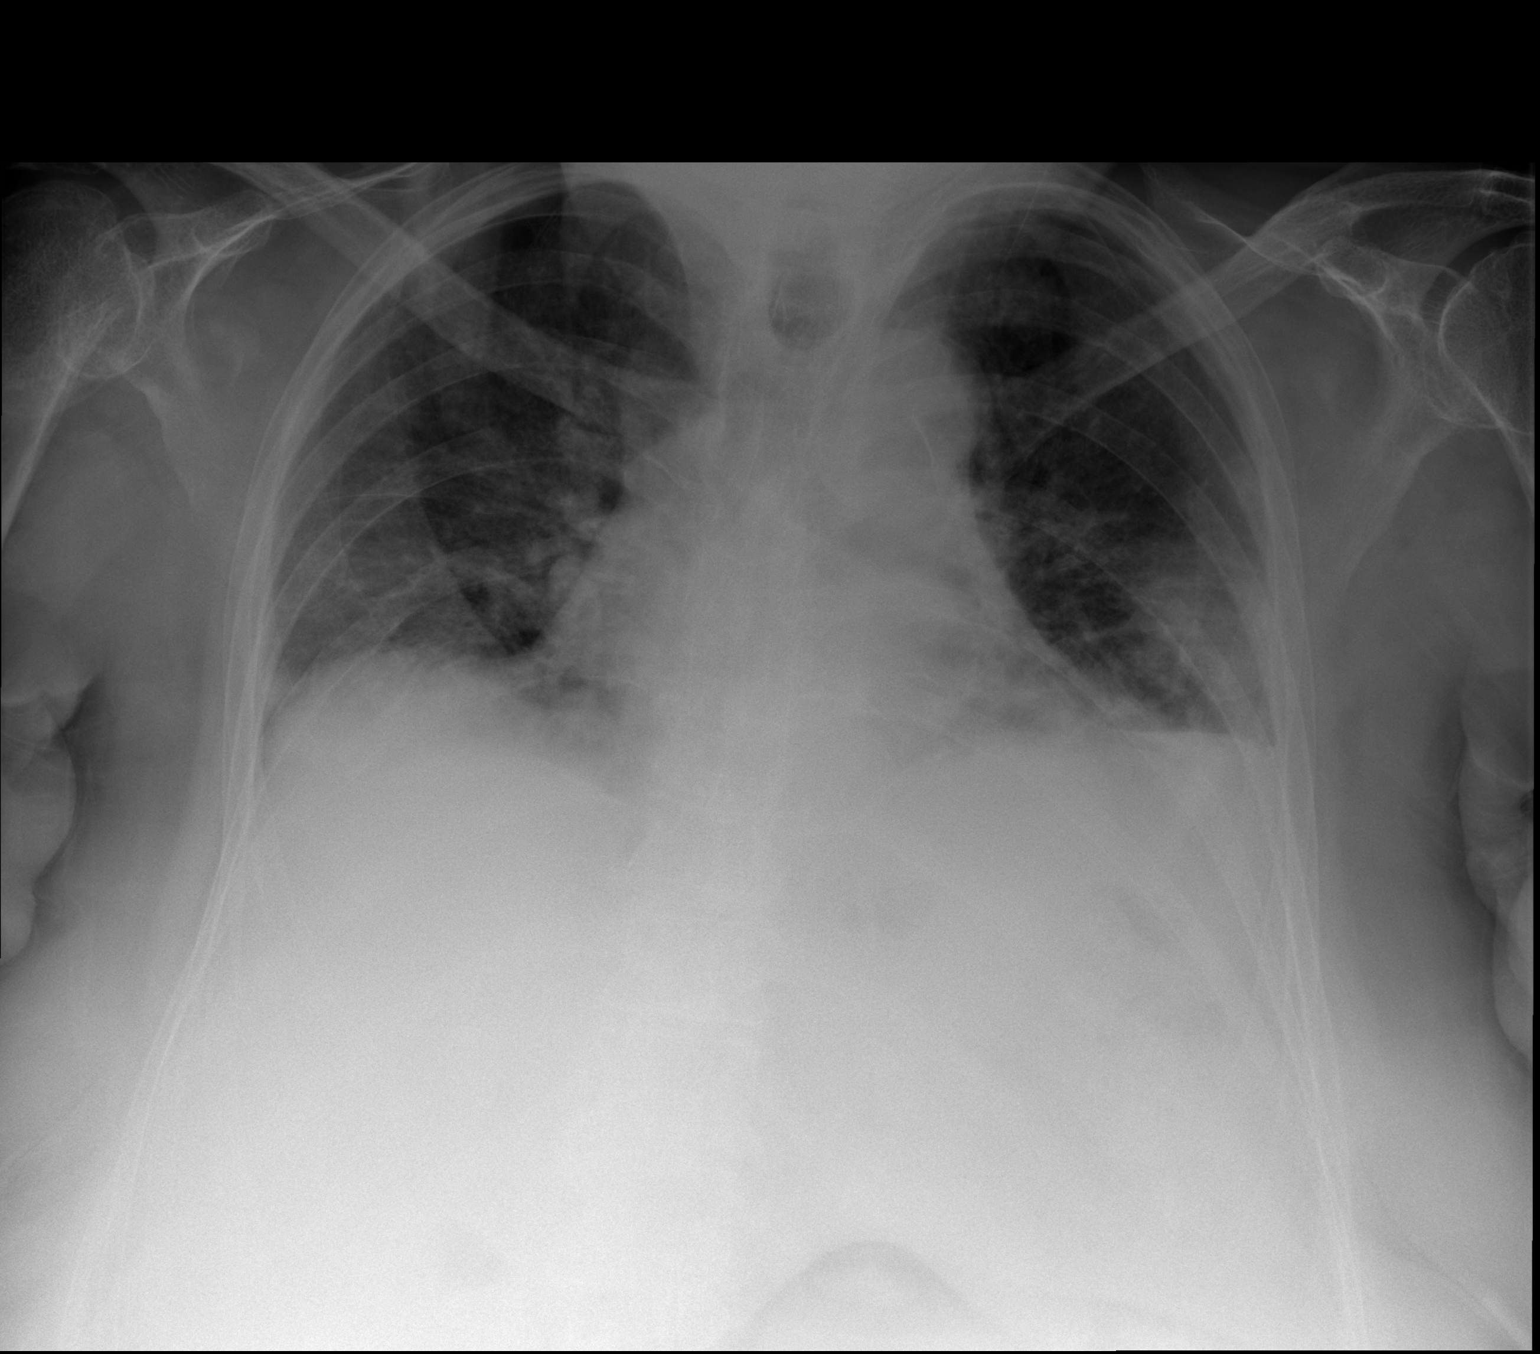

[2 of 2 positions shown; findings below may reference images not displayed]

FINDINGS: Lung volumes are low. There is opacity at the bases consistent with
atelectasis in combination with prominent bronchovascular markings.
No convincing pneumonia and no pulmonary edema.

Cardiac silhouette is mildly enlarged. No mediastinal or hilar
masses.

No pleural effusion or pneumothorax.

Skeletal structures are grossly intact.
IMPRESSION: No acute cardiopulmonary disease.

## 2019-08-22 IMAGING — CT CT CERVICAL SPINE W/O CM
3 series · 14 of 33 positions shown, 17 images · non-contrast
Comparison: None.

CLINICAL DATA: Unsteady gait. Unwitnessed fall.

EXAM:
CT CERVICAL SPINE WITHOUT CONTRAST
TECHNIQUE: Multidetector CT imaging of the cervical spine was performed without
intravenous contrast. Multiplanar CT image reconstructions were also
generated.

[Series 4: c spine soft · axial · 0.29mm/px · z∈[-312,-182]mm · 6 of 86 slices shown, 8 images]
[im 14/86  soft-tissue]
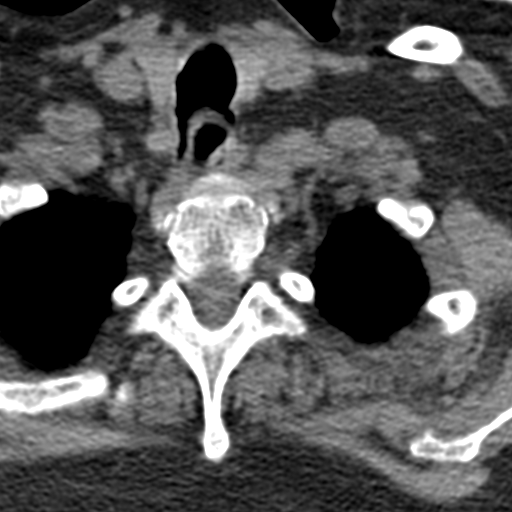
[im 14/86  bone]
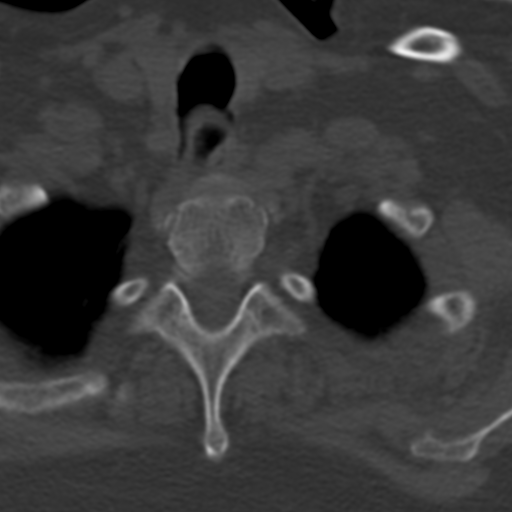
[im 27/86  bone]
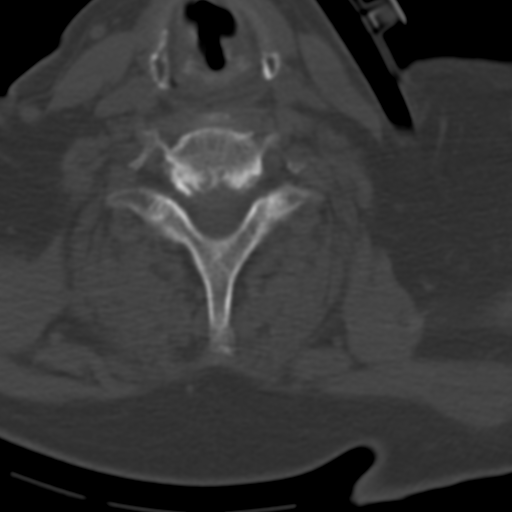
[im 40/86  bone]
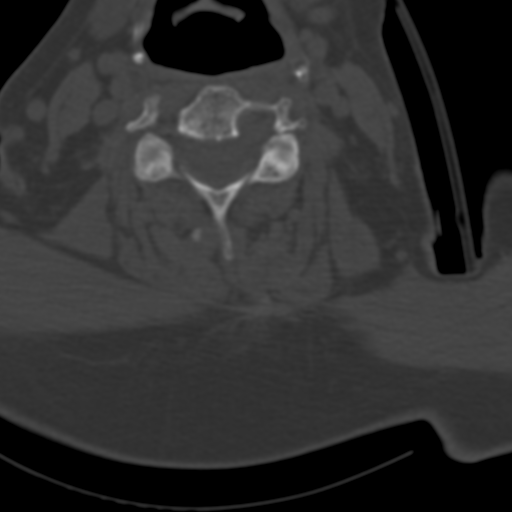
[im 53/86  bone]
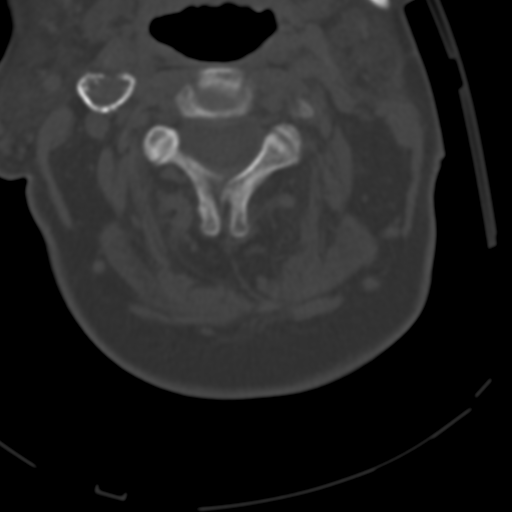
[im 66/86  soft-tissue]
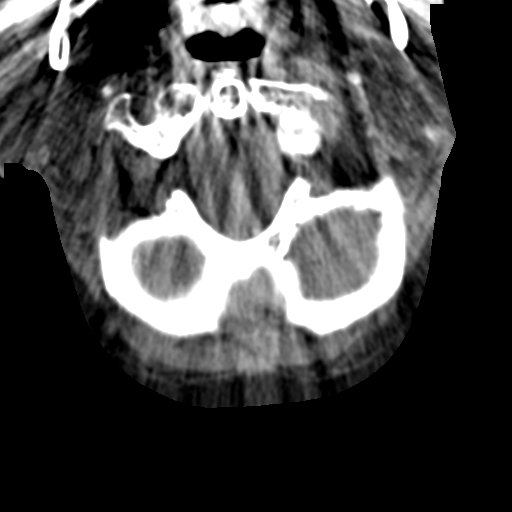
[im 66/86  bone]
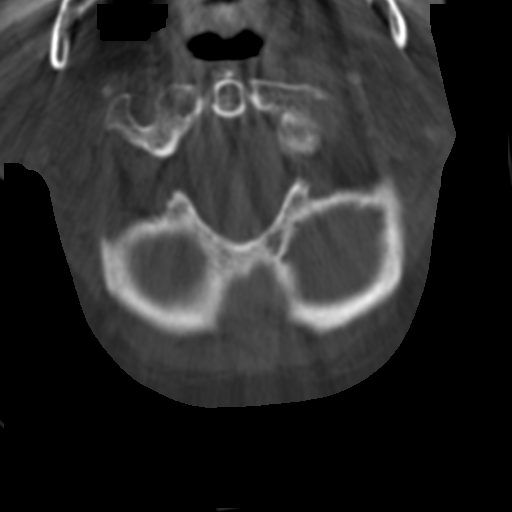
[im 79/86  bone]
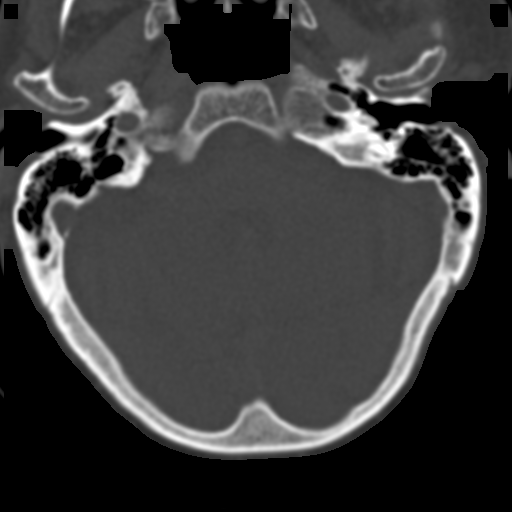

[Series 8: coronal bone · coronal · 0.25mm/px · 3 of 80 slices shown]
[im 16/80  bone]
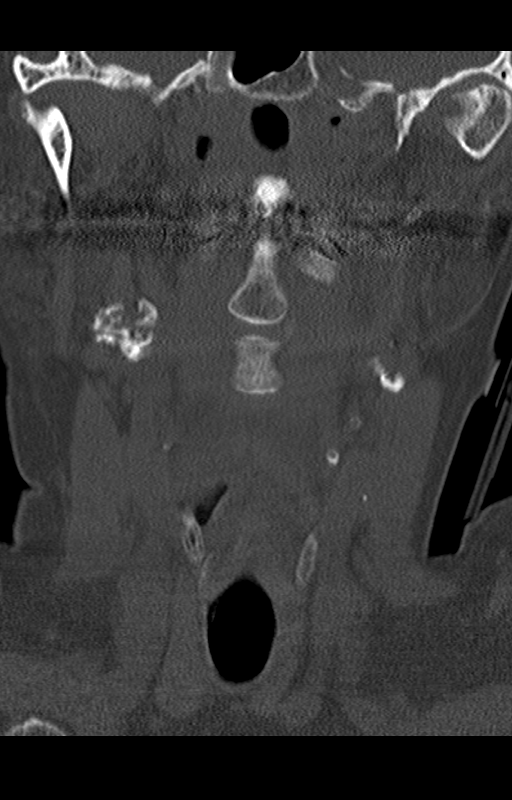
[im 32/80  bone]
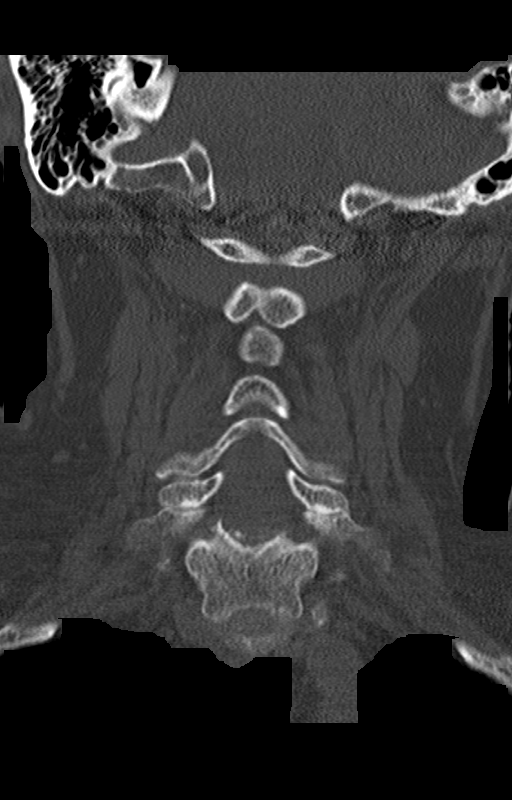
[im 48/80  bone]
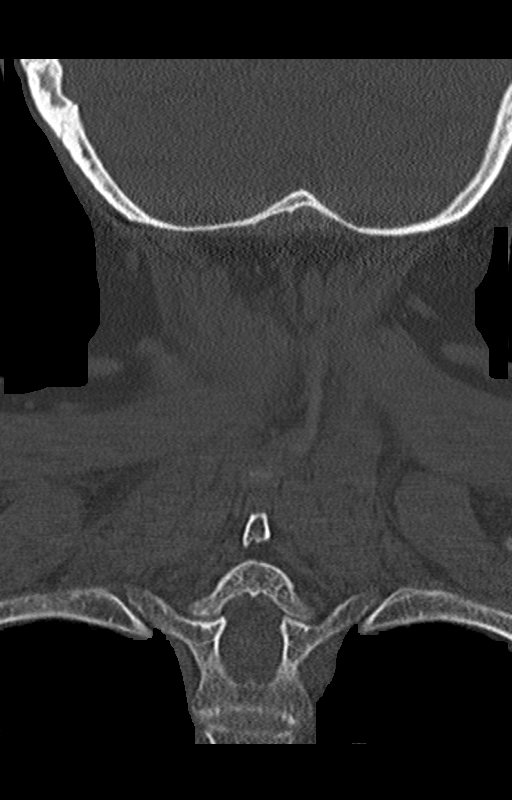

[Series 9: sagittal bone · sagittal · 0.32mm/px · 5 of 61 slices shown, 6 images]
[im 21/61  bone]
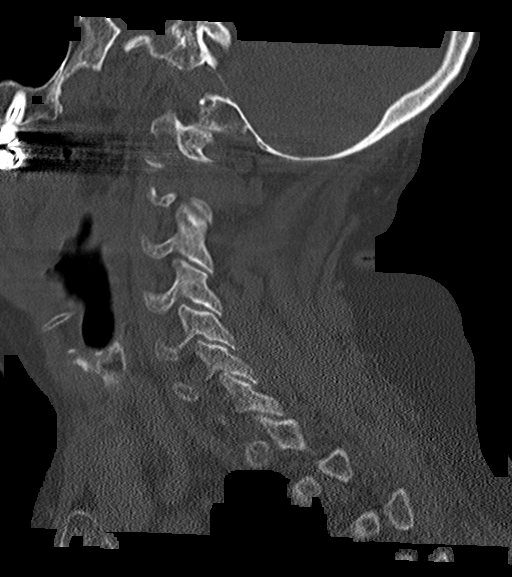
[im 26/61  bone]
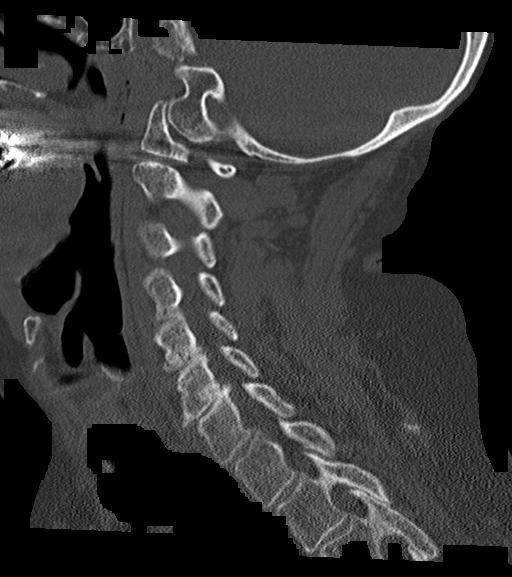
[im 31/61  soft-tissue]
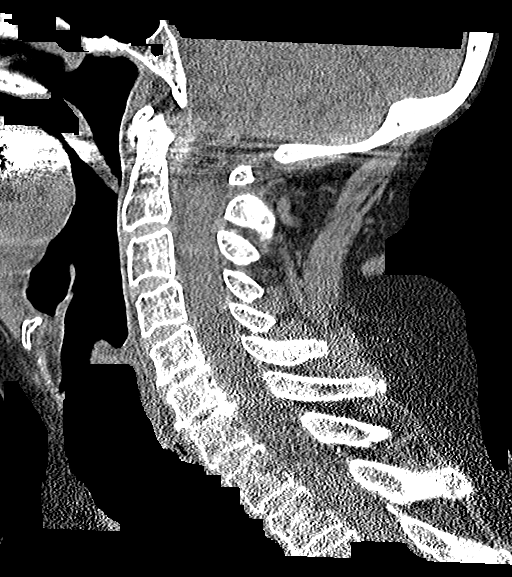
[im 31/61  bone]
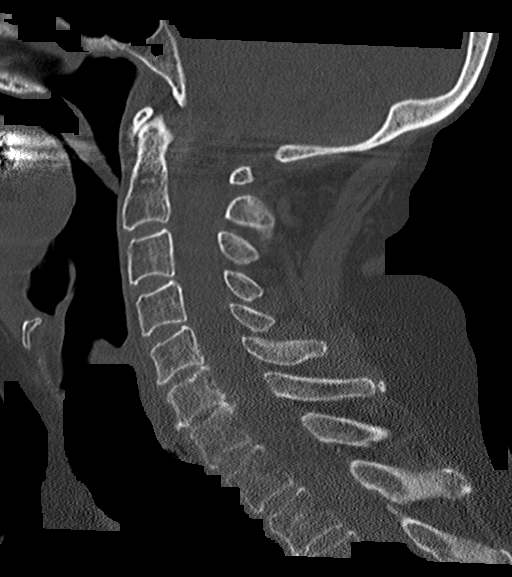
[im 36/61  bone]
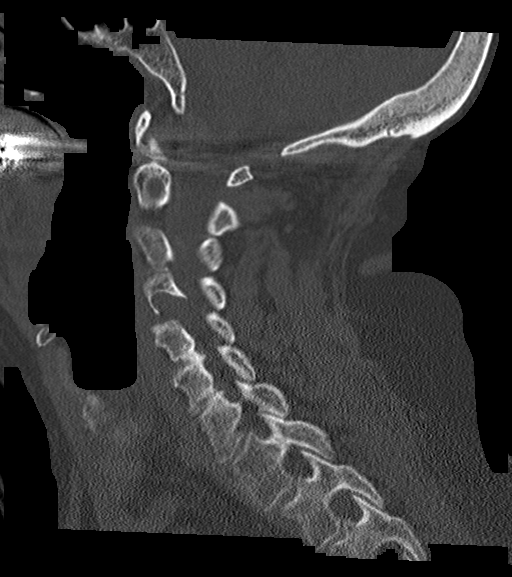
[im 41/61  bone]
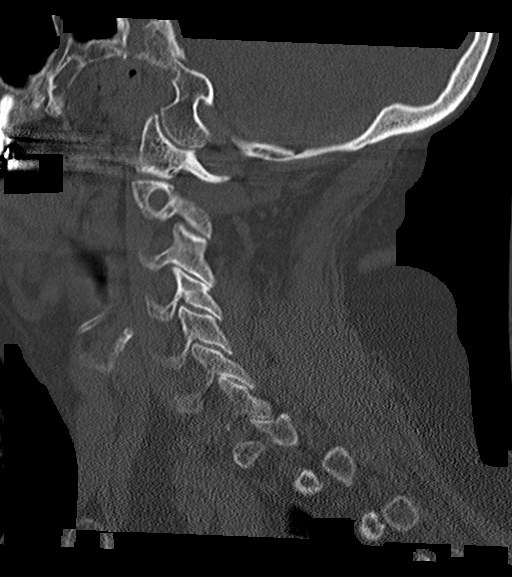

[14 of 33 positions shown; findings below may reference images not displayed]

FINDINGS: Alignment: Normal anatomic alignment.

Skull base and vertebrae: No acute fracture. No primary bone lesion
or focal pathologic process. Probable left vertebral ectasia
accounting for prominent foramina along the left side.

Soft tissues and spinal canal: No prevertebral fluid or swelling. No
visible canal hematoma.

Disc levels:  Degenerative disc disease most pronounced C6-7.

Upper chest: Negative.

Other: None
IMPRESSION: No evidence of acute traumatic injury to the cervical spine.

Degenerative changes.

## 2019-08-22 IMAGING — CT CT HEAD W/O CM
3 series · 15 of 47 positions shown, 18 images · non-contrast
Comparison: Brain CT 05/20/2018

CLINICAL DATA: Patient with fall.

EXAM:
CT HEAD WITHOUT CONTRAST
TECHNIQUE: Contiguous axial images were obtained from the base of the skull
through the vertex without intravenous contrast.

[Series 2: head wo · axial · 0.47mm/px · z∈[-167,-32]mm · 9 of 33 slices shown, 12 images]
[im 3/33  brain]
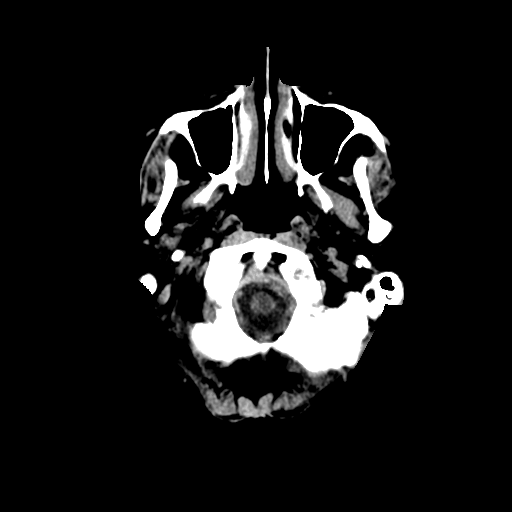
[im 3/33  bone]
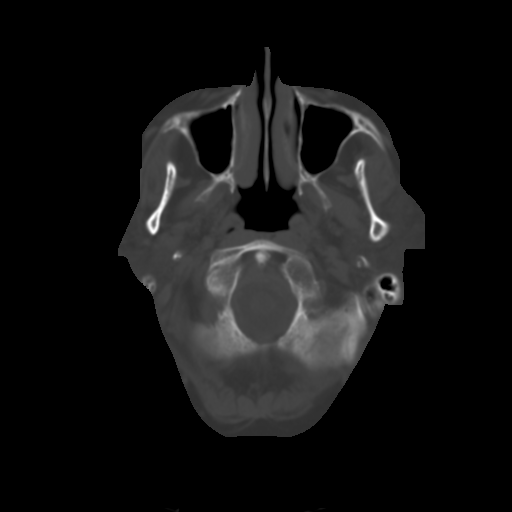
[im 6/33  brain]
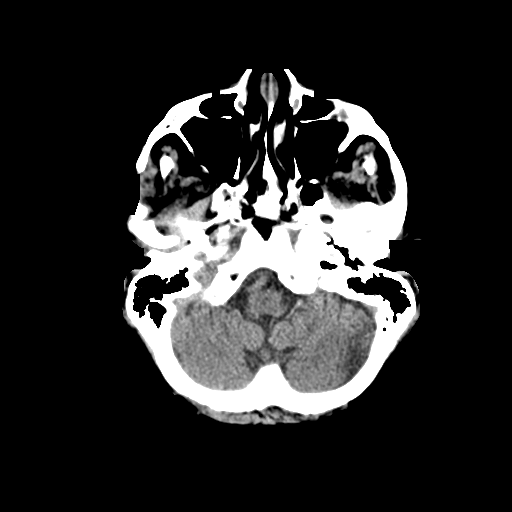
[im 9/33  brain]
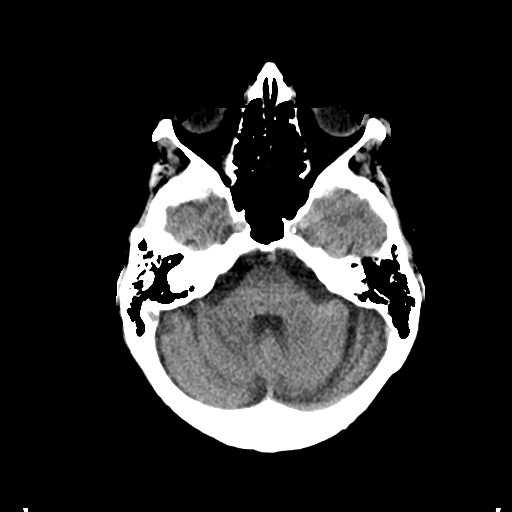
[im 13/33  brain]
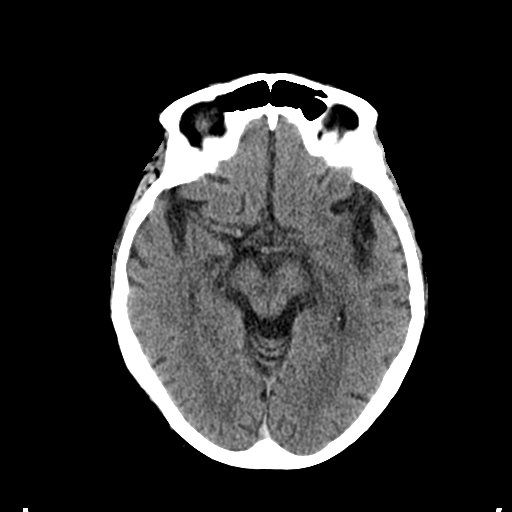
[im 17/33  brain]
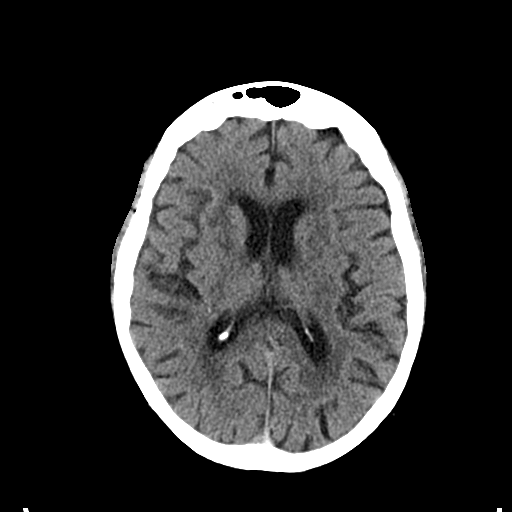
[im 17/33  bone]
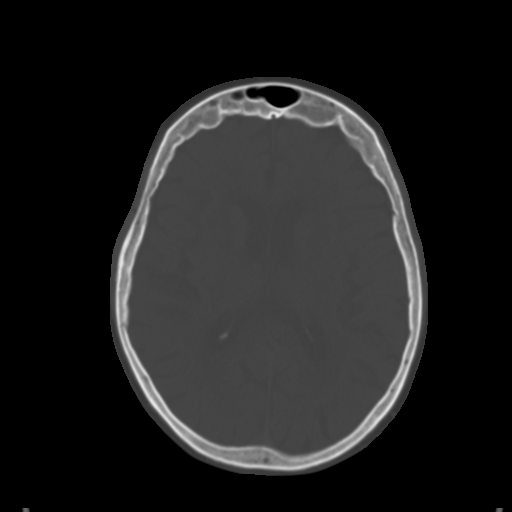
[im 20/33  brain]
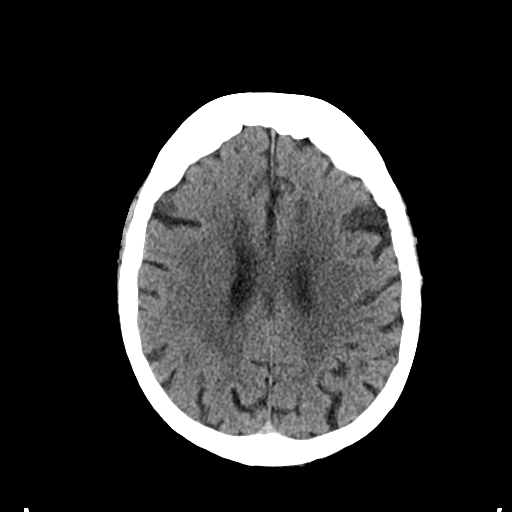
[im 24/33  brain]
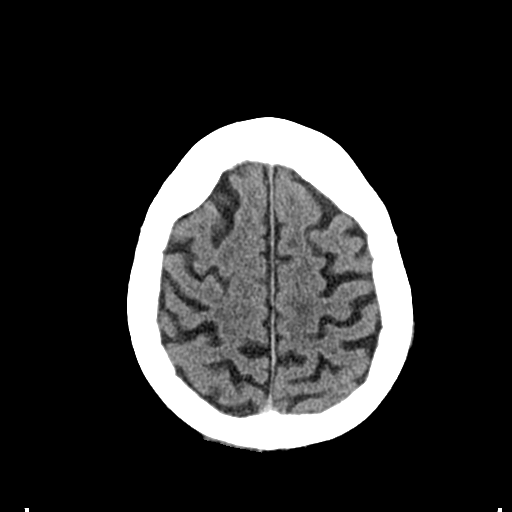
[im 27/33  brain]
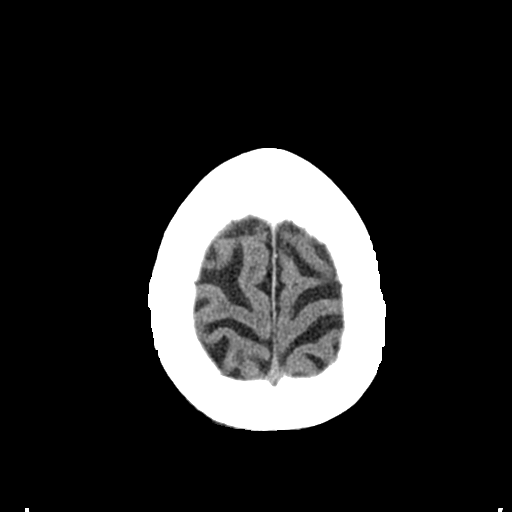
[im 30/33  brain]
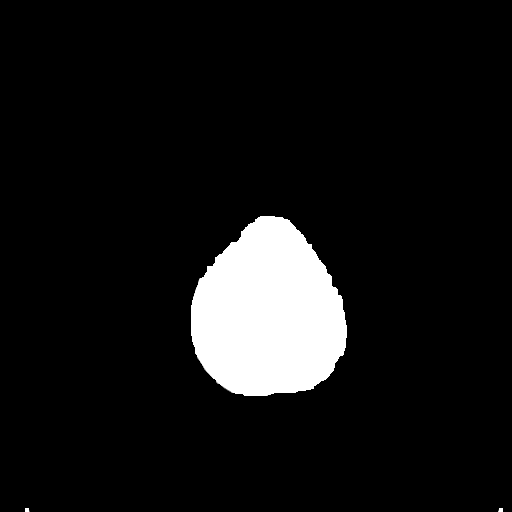
[im 30/33  bone]
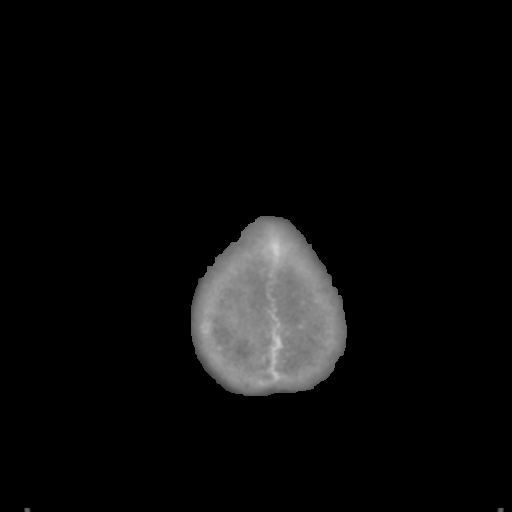

[Series 4: coronal soft tissue · coronal · 0.31mm/px · 3 of 84 slices shown]
[im 28/84  brain]
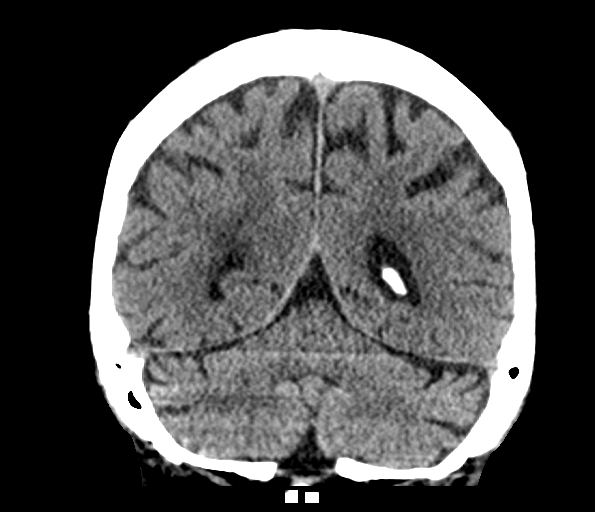
[im 37/84  brain]
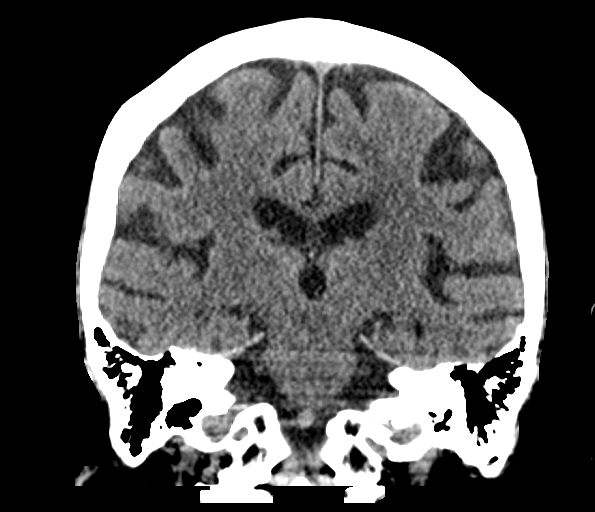
[im 47/84  brain]
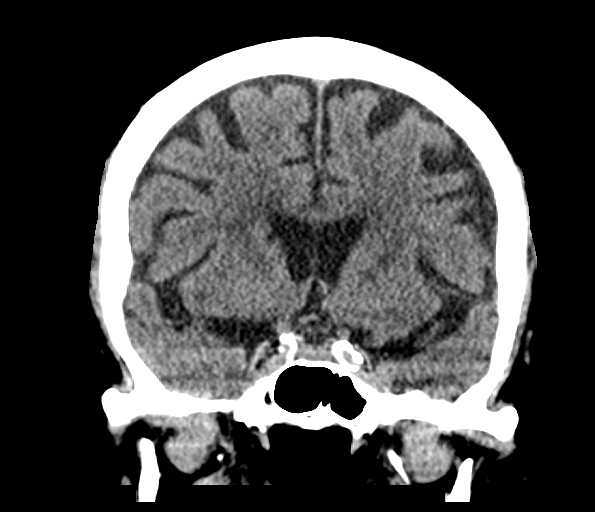

[Series 5: sagittal soft tissue · sagittal · 0.31mm/px · 3 of 64 slices shown]
[im 22/64  brain]
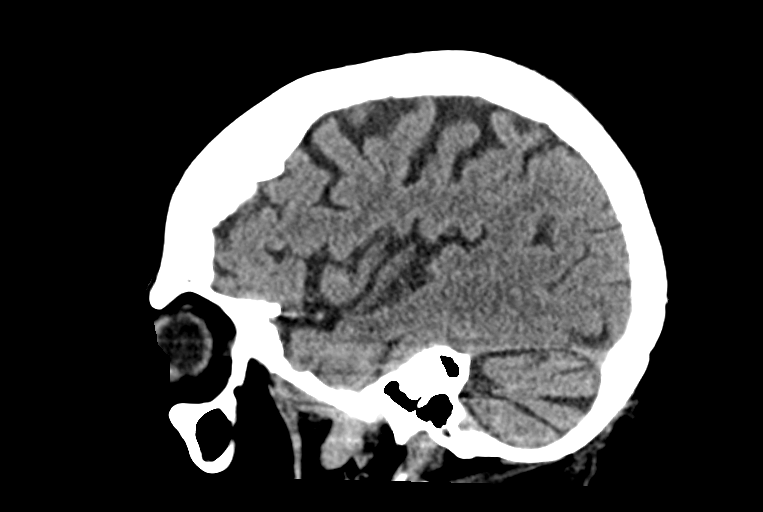
[im 32/64  brain]
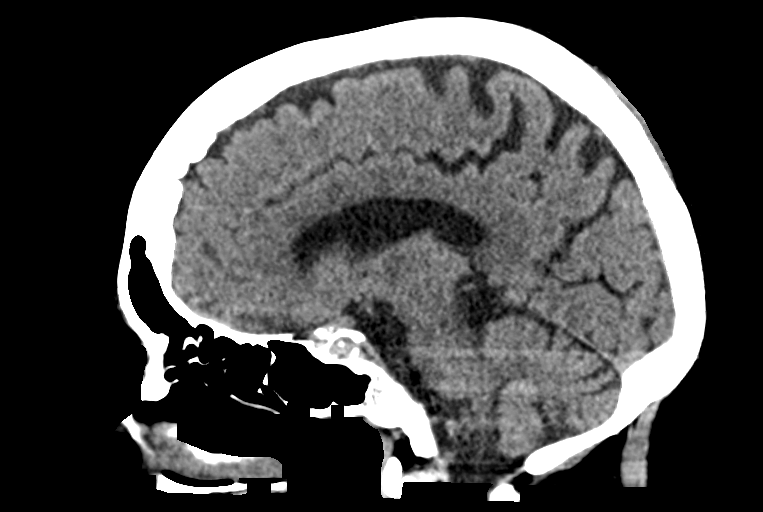
[im 43/64  brain]
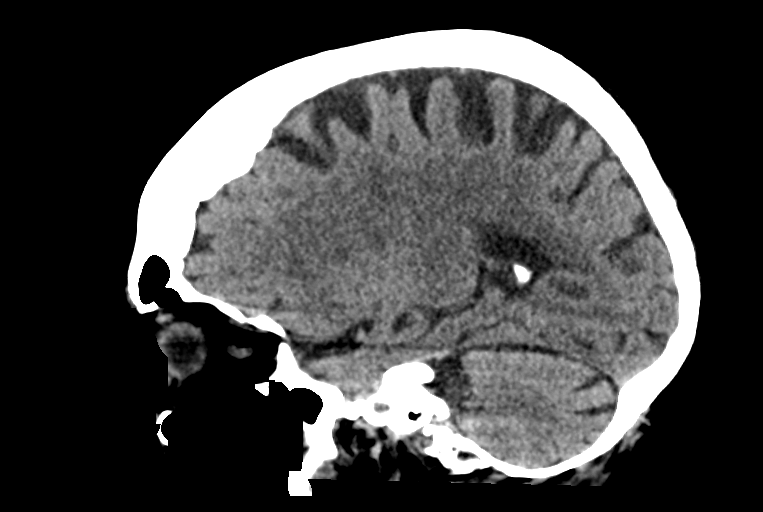

[15 of 47 positions shown; findings below may reference images not displayed]

FINDINGS: Brain: Ventricles and sulci are appropriate for patient's age. No
evidence for acute cortically based infarct, intracranial
hemorrhage, mass lesion or mass-effect. Unchanged chronic basal
ganglia lacunar infarcts.

Vascular: Unremarkable

Skull: Intact.

Sinuses/Orbits: Paranasal sinuses are well aerated. Mastoid air
cells are unremarkable. Orbits are unremarkable.

Other: None.
IMPRESSION: No acute intracranial process. Atrophy and chronic microvascular
ischemic changes.

## 2021-03-21 ENCOUNTER — Encounter (HOSPITAL_COMMUNITY): Payer: Self-pay | Admitting: Radiology

## 2021-03-21 ENCOUNTER — Emergency Department (HOSPITAL_COMMUNITY): Payer: Medicare HMO

## 2021-03-21 ENCOUNTER — Inpatient Hospital Stay (HOSPITAL_COMMUNITY)
Admission: EM | Admit: 2021-03-21 | Discharge: 2021-04-01 | DRG: 956 | Disposition: A | Discharge status: Skilled Nursing Facility | Payer: Medicare HMO | Source: Skilled Nursing Facility | Attending: Internal Medicine | Admitting: Internal Medicine

## 2021-03-21 DIAGNOSIS — Z66 Do not resuscitate: Secondary | ICD-10-CM | POA: Diagnosis present

## 2021-03-21 DIAGNOSIS — E860 Dehydration: Secondary | ICD-10-CM | POA: Diagnosis present

## 2021-03-21 DIAGNOSIS — Z79899 Other long term (current) drug therapy: Secondary | ICD-10-CM

## 2021-03-21 DIAGNOSIS — S065X9A Traumatic subdural hemorrhage with loss of consciousness of unspecified duration, initial encounter: Secondary | ICD-10-CM | POA: Diagnosis present

## 2021-03-21 DIAGNOSIS — R627 Adult failure to thrive: Secondary | ICD-10-CM | POA: Diagnosis present

## 2021-03-21 DIAGNOSIS — S72009A Fracture of unspecified part of neck of unspecified femur, initial encounter for closed fracture: Secondary | ICD-10-CM | POA: Diagnosis present

## 2021-03-21 DIAGNOSIS — M329 Systemic lupus erythematosus, unspecified: Secondary | ICD-10-CM | POA: Diagnosis present

## 2021-03-21 DIAGNOSIS — W1830XA Fall on same level, unspecified, initial encounter: Secondary | ICD-10-CM | POA: Diagnosis present

## 2021-03-21 DIAGNOSIS — S01111A Laceration without foreign body of right eyelid and periocular area, initial encounter: Secondary | ICD-10-CM | POA: Diagnosis present

## 2021-03-21 DIAGNOSIS — I720 Aneurysm of carotid artery: Secondary | ICD-10-CM | POA: Insufficient documentation

## 2021-03-21 DIAGNOSIS — I509 Heart failure, unspecified: Secondary | ICD-10-CM | POA: Diagnosis present

## 2021-03-21 DIAGNOSIS — Z79890 Hormone replacement therapy (postmenopausal): Secondary | ICD-10-CM

## 2021-03-21 DIAGNOSIS — E039 Hypothyroidism, unspecified: Secondary | ICD-10-CM | POA: Diagnosis present

## 2021-03-21 DIAGNOSIS — Z9889 Other specified postprocedural states: Secondary | ICD-10-CM

## 2021-03-21 DIAGNOSIS — Z888 Allergy status to other drugs, medicaments and biological substances status: Secondary | ICD-10-CM | POA: Diagnosis not present

## 2021-03-21 DIAGNOSIS — I629 Nontraumatic intracranial hemorrhage, unspecified: Secondary | ICD-10-CM

## 2021-03-21 DIAGNOSIS — W19XXXA Unspecified fall, initial encounter: Secondary | ICD-10-CM

## 2021-03-21 DIAGNOSIS — Z20822 Contact with and (suspected) exposure to covid-19: Secondary | ICD-10-CM | POA: Diagnosis present

## 2021-03-21 DIAGNOSIS — S72001A Fracture of unspecified part of neck of right femur, initial encounter for closed fracture: Secondary | ICD-10-CM | POA: Diagnosis present

## 2021-03-21 DIAGNOSIS — F0150 Vascular dementia without behavioral disturbance: Secondary | ICD-10-CM | POA: Diagnosis not present

## 2021-03-21 DIAGNOSIS — Z882 Allergy status to sulfonamides status: Secondary | ICD-10-CM | POA: Diagnosis not present

## 2021-03-21 DIAGNOSIS — Z8249 Family history of ischemic heart disease and other diseases of the circulatory system: Secondary | ICD-10-CM

## 2021-03-21 DIAGNOSIS — F0151 Vascular dementia with behavioral disturbance: Secondary | ICD-10-CM | POA: Diagnosis present

## 2021-03-21 DIAGNOSIS — F01518 Vascular dementia, unspecified severity, with other behavioral disturbance: Secondary | ICD-10-CM | POA: Diagnosis present

## 2021-03-21 DIAGNOSIS — IMO0002 Reserved for concepts with insufficient information to code with codable children: Secondary | ICD-10-CM | POA: Insufficient documentation

## 2021-03-21 DIAGNOSIS — S0181XA Laceration without foreign body of other part of head, initial encounter: Secondary | ICD-10-CM

## 2021-03-21 DIAGNOSIS — Z8781 Personal history of (healed) traumatic fracture: Secondary | ICD-10-CM

## 2021-03-21 LAB — BASIC METABOLIC PANEL
Anion gap: 8 (ref 5–15)
BUN: 20 mg/dL (ref 8–23)
CO2: 29 mmol/L (ref 22–32)
Calcium: 9.5 mg/dL (ref 8.9–10.3)
Chloride: 102 mmol/L (ref 98–111)
Creatinine, Ser: 0.48 mg/dL (ref 0.44–1.00)
GFR, Estimated: >60 mL/min (ref >60)
Glucose, Bld: 122 mg/dL — ABNORMAL HIGH (ref 70–99)
Potassium: 4.3 mmol/L (ref 3.5–5.1)
Sodium: 139 mmol/L (ref 135–145)

## 2021-03-21 LAB — CBC WITH DIFFERENTIAL/PLATELET
Abs Immature Granulocytes: 0.03 10*3/uL (ref 0.00–0.07)
Basophils Absolute: 0 10*3/uL (ref 0.0–0.1)
Basophils Relative: 0 %
Eosinophils Absolute: 0 10*3/uL (ref 0.0–0.5)
Eosinophils Relative: 0 %
HCT: 43.1 % (ref 36.0–46.0)
Hemoglobin: 14 g/dL (ref 12.0–15.0)
Immature Granulocytes: 0 %
Lymphocytes Relative: 6 %
Lymphs Abs: 0.6 10*3/uL — ABNORMAL LOW (ref 0.7–4.0)
MCH: 30.8 pg (ref 26.0–34.0)
MCHC: 32.5 g/dL (ref 30.0–36.0)
MCV: 94.9 fL (ref 80.0–100.0)
Monocytes Absolute: 0.6 10*3/uL (ref 0.1–1.0)
Monocytes Relative: 6 %
Neutro Abs: 8.8 10*3/uL — ABNORMAL HIGH (ref 1.7–7.7)
Neutrophils Relative %: 88 %
Platelets: 157 10*3/uL (ref 150–400)
RBC: 4.54 MIL/uL (ref 3.87–5.11)
RDW: 13.8 % (ref 11.5–15.5)
WBC: 10.1 10*3/uL (ref 4.0–10.5)
nRBC: 0 % (ref 0.0–0.2)

## 2021-03-21 LAB — RESP PANEL BY RT-PCR (FLU A&B, COVID) ARPGX2
Influenza A by PCR: NEGATIVE
Influenza B by PCR: NEGATIVE
SARS Coronavirus 2 by RT PCR: NEGATIVE

## 2021-03-21 MED ORDER — LORAZEPAM 0.5 MG PO TABS
0.5000 mg | ORAL_TABLET | Freq: Two times a day (BID) | ORAL | Status: DC
  Administered 2021-03-21 – 2021-04-01 (×22): 0.5 mg via ORAL
  Filled 2021-03-21 (×22): qty 1

## 2021-03-21 MED ORDER — HYDROCODONE-ACETAMINOPHEN 5-325 MG PO TABS: 1.0000 | ORAL_TABLET | Freq: Four times a day (QID) | ORAL | Status: DC | PRN

## 2021-03-21 MED ORDER — LORAZEPAM 2 MG/ML IJ SOLN: 1.0000 mg | Freq: Once | INTRAMUSCULAR | Status: DC

## 2021-03-21 MED ORDER — SERTRALINE HCL 100 MG PO TABS
100.0000 mg | ORAL_TABLET | Freq: Every day | ORAL | Status: DC
  Administered 2021-03-21 – 2021-04-01 (×11): 100 mg via ORAL
  Filled 2021-03-21 (×11): qty 1

## 2021-03-21 MED ORDER — LORAZEPAM 2 MG/ML IJ SOLN
1.0000 mg | Freq: Once | INTRAMUSCULAR | Status: AC
  Administered 2021-03-21: 1 mg via INTRAMUSCULAR
  Filled 2021-03-21: qty 1

## 2021-03-21 MED ORDER — CEFAZOLIN SODIUM-DEXTROSE 2-4 GM/100ML-% IV SOLN
2.0000 g | INTRAVENOUS | Status: DC
  Filled 2021-03-21: qty 100

## 2021-03-21 MED ORDER — CHLORHEXIDINE GLUCONATE 4 % EX LIQD
60.0000 mL | Freq: Once | CUTANEOUS | Status: AC
  Administered 2021-03-22: 4 via TOPICAL

## 2021-03-21 MED ORDER — DIVALPROEX SODIUM 250 MG PO DR TAB
250.0000 mg | DELAYED_RELEASE_TABLET | Freq: Three times a day (TID) | ORAL | Status: DC
  Administered 2021-03-21 – 2021-04-01 (×32): 250 mg via ORAL
  Filled 2021-03-21 (×33): qty 1

## 2021-03-21 MED ORDER — DIVALPROEX SODIUM 125 MG PO CSDR: 250.0000 mg | DELAYED_RELEASE_CAPSULE | Freq: Three times a day (TID) | ORAL | Status: DC

## 2021-03-21 MED ORDER — POLYETHYLENE GLYCOL 3350 17 G PO PACK: 17.0000 g | PACK | Freq: Every day | ORAL | Status: DC | PRN

## 2021-03-21 MED ORDER — TRANEXAMIC ACID-NACL 1000-0.7 MG/100ML-% IV SOLN
1000.0000 mg | INTRAVENOUS | Status: AC
  Administered 2021-03-22: 1000 mg via INTRAVENOUS
  Filled 2021-03-21: qty 100

## 2021-03-21 MED ORDER — HALOPERIDOL 1 MG PO TABS
1.0000 mg | ORAL_TABLET | Freq: Three times a day (TID) | ORAL | Status: DC
  Administered 2021-03-21 – 2021-04-01 (×32): 1 mg via ORAL
  Filled 2021-03-21 (×34): qty 1

## 2021-03-21 MED ORDER — POVIDONE-IODINE 10 % EX SWAB
2.0000 "application " | Freq: Once | CUTANEOUS | Status: AC
  Administered 2021-03-22: 2 via TOPICAL

## 2021-03-21 MED ORDER — MORPHINE SULFATE (PF) 2 MG/ML IV SOLN
0.5000 mg | INTRAVENOUS | Status: DC | PRN
  Administered 2021-03-21: 0.5 mg via INTRAVENOUS
  Filled 2021-03-21: qty 1

## 2021-03-21 NOTE — H&P (Signed)
History and Physical    Tamara Fisher HYW:[id number] DOB: 1941/02/11 DOA: 03/21/2021  PCP: Medicine, [facility]  Patient coming from: SNF [facility])   Chief Complaint: fall  HPI: Tamara Fisher is a 80 y.o. female with medical history significant for advanced dementia, who presents with the above.  Witnessed mechanical fall at her facility earlier today. Reportedly usual state of health. Is somewhat ambulatory at baseline. Wound to right eye. Not complaining of pain. Does have baseline behavioral disturbance.  ED Course:   Imaging, orthopedics consult.  Review of Systems: As per HPI otherwise 10 point review of systems negative.    Past Medical History:  Diagnosis Date   Aneurysm of left carotid artery (HCC)    Dementia (HCC)    Hypothyroidism    Lupus (HCC)     No past surgical history on file.   reports that she has never smoked. She has never used smokeless tobacco. She reports that she does not drink alcohol and does not use drugs.  Allergies  Allergen Reactions   Tetracyclines & Related Nausea And Vomiting   Sulfa Antibiotics Rash    Family History  Problem Relation Age of Onset   Hypertension Other     Prior to Admission medications   Medication Sig Start Date End Date Taking? Authorizing Provider  acetaminophen (TYLENOL) 500 MG tablet Take 500 mg by mouth 2 (two) times daily. MAY ALSO TAKE AN EXTRA TABLET DAILY FOR BREAKTHROUGH PAIN    [provider]  cholecalciferol (VITAMIN D) 1000 units tablet Take 1,000 Units by mouth daily.    [provider]  divalproex (DEPAKOTE SPRINKLE) 125 MG capsule Take 250 mg by mouth 3 (three) times daily.     [provider]  guaiFENesin (ROBITUSSIN) 100 MG/5ML SOLN Take 20 mLs by mouth every 6 (six) hours as needed for cough or to loosen phlegm.    [provider]  levothyroxine (SYNTHROID, LEVOTHROID) 75 MCG tablet Take 1 tablet (75 mcg total) by mouth daily before  breakfast. 08/23/17   Short, Thea Silversmith, MD  LORazepam (ATIVAN) 0.5 MG tablet Take 1 tablet (0.5 mg total) by mouth 2 (two) times daily. Patient taking differently: Take 0.25 mg by mouth 2 (two) times daily.  07/25/18   Sharee Holster, NP  Melatonin 3 MG TBDP Take 6 mg by mouth at bedtime.     [provider]  NON FORMULARY Diet type: Regular diet - regular texture    [provider]  OLANZapine (ZYPREXA) 10 MG tablet Take 10 mg by mouth at bedtime.     [provider]  ondansetron (ZOFRAN) 4 MG tablet Take 4 mg by mouth every 4 (four) hours as needed for nausea.     [provider]  sertraline (ZOLOFT) 100 MG tablet Take 100 mg by mouth daily.     [provider]  traZODone (DESYREL) 50 MG tablet Take 25 mg by mouth at bedtime.    [provider]    Physical Exam: Vitals:   03/21/21 0955 03/21/21 1115 03/21/21 1215 03/21/21 1315  BP: 128/71 129/72 (!) 133/93 (!) 133/93  Pulse: 80 74 80 81  Resp: 16 14 15 14   Temp: 97.6 F (36.4 C)     TempSrc: Oral     SpO2: 97% 99% 100% 100%  Weight:      Height:        Constitutional: No acute distress Head: bruise right eye with laceration that is glued closed Eyes: Conjunctiva clear ENM:  Moist mucous membranes.   Neck: Supple Respiratory: Clear to auscultation bilaterally, no wheezing/rales/rhonchi. Normal respiratory effort. No accessory muscle use. . Cardiovascular: Regular rate and rhythm. Soft systolic murmur Abdomen: Non-tender, non-distended. No masses. No rebound or guarding. Positive bowel sounds. Musculoskeletal: No joint deformity upper and lower extremities. Normal ROM, no contractures. Normal muscle tone.  Skin: No rashes, lesions, or ulcers. Sacrum examined Extremities: No peripheral edema. Palpable peripheral pulses. Neurologic: awake, moving all 4 extremities Psychiatric: unable to assess   Labs on Admission: I have personally reviewed following labs and imaging  studies  CBC: Recent Labs  Lab 03/21/21 1018  WBC 10.1  NEUTROABS 8.8*  HGB 14.0  HCT 43.1  MCV 94.9  PLT 157   Basic Metabolic Panel: Recent Labs  Lab 03/21/21 1018  NA 139  K 4.3  CL 102  CO2 29  GLUCOSE 122*  BUN 20  CREATININE 0.48  CALCIUM 9.5   GFR: Estimated Creatinine Clearance: 53.4 mL/min (by C-G formula based on SCr of 0.48 mg/dL). Liver Function Tests: No results for input(s): AST, ALT, ALKPHOS, BILITOT, PROT, ALBUMIN in the last 168 hours. No results for input(s): LIPASE, AMYLASE in the last 168 hours. No results for input(s): AMMONIA in the last 168 hours. Coagulation Profile: No results for input(s): INR, PROTIME in the last 168 hours. Cardiac Enzymes: No results for input(s): CKTOTAL, CKMB, CKMBINDEX, TROPONINI in the last 168 hours. BNP (last 3 results) No results for input(s): PROBNP in the last 8760 hours. HbA1C: No results for input(s): HGBA1C in the last 72 hours. CBG: No results for input(s): GLUCAP in the last 168 hours. Lipid Profile: No results for input(s): CHOL, HDL, LDLCALC, TRIG, CHOLHDL, LDLDIRECT in the last 72 hours. Thyroid Function Tests: No results for input(s): TSH, T4TOTAL, FREET4, T3FREE, THYROIDAB in the last 72 hours. Anemia Panel: No results for input(s): VITAMINB12, FOLATE, FERRITIN, TIBC, IRON, RETICCTPCT in the last 72 hours. Urine analysis:    Component Value Date/Time   COLORURINE YELLOW 10/17/2018 0755   APPEARANCEUR HAZY (A) 10/17/2018 0755   LABSPEC 1.024 10/17/2018 0755   PHURINE 5.0 10/17/2018 0755   GLUCOSEU NEGATIVE 10/17/2018 0755   HGBUR NEGATIVE 10/17/2018 0755   BILIRUBINUR NEGATIVE 10/17/2018 0755   KETONESUR NEGATIVE 10/17/2018 0755   PROTEINUR NEGATIVE 10/17/2018 0755   NITRITE NEGATIVE 10/17/2018 0755   LEUKOCYTESUR LARGE (A) 10/17/2018 0755    Radiological Exams on Admission: DG Pelvis 1-2 Views  Result Date: 03/21/2021 CLINICAL DATA:  Acute RIGHT hip pain following fall. Initial  encounter. EXAM: PELVIS - 1-2 VIEW COMPARISON:  None. FINDINGS: A RIGHT femoral neck fracture is noted with approximately 1.5 cm SUPERIOR displacement. There is no evidence of dislocation. No other fractures are identified. IMPRESSION: RIGHT femoral neck fracture with approximately 1.5 cm SUPERIOR displacement. Electronically Signed   By: Harmon PierJeffrey  Hu M.D.   On: 03/21/2021 10:10   CT Head Wo Contrast  Result Date: 03/21/2021 CLINICAL DATA:  80 year old female status post fall. Right forehead laceration. EXAM: CT HEAD WITHOUT CONTRAST TECHNIQUE: Contiguous axial images were obtained from the base of the skull through the vertex without intravenous contrast. COMPARISON:  Head CT 09/12/2018. Face and cervical spine CT today reported separately. FINDINGS: Brain: No midline shift, mass effect, or evidence of intracranial mass lesion. No ventriculomegaly. Confluent bilateral deep white matter hypodensity appears stable since 2019 compatible with chronic small vessel disease. Chronic thickening of the falx is noted, but at the level of the pericallosal sulcus thickness and density appear increased compared  to 2019 suspicious for trace para falcine subdural blood (series 6, image 49). But no other intracranial hemorrhage is identified. No cortically based acute infarct identified. No suspicious intracranial vascular hyperdensity. Vascular: Calcified atherosclerosis at the skull base. Skull: No fracture identified.  Stable hyperostosis frontalis. Sinuses/Orbits: Visualized paranasal sinuses and mastoids are stable and well aerated. Other: Mild right forehead scalp hematoma or contusion on series 4, image 29. No scalp soft tissue gas. Visualized orbit soft tissues are within normal limits. IMPRESSION: 1. Appearance suspicious for trace parafalcine subdural blood. 2. No intracranial mass effect or other acute traumatic injury to the brain identified. Chronic small vessel disease. 3. Mild right forehead scalp hematoma or  contusion without underlying skull fracture. Electronically Signed   By: Odessa Fleming M.D.   On: 03/21/2021 10:59   CT Cervical Spine Wo Contrast  Result Date: 03/21/2021 CLINICAL DATA:  80 year old female status post fall. Right forehead laceration. EXAM: CT CERVICAL SPINE WITHOUT CONTRAST TECHNIQUE: Multidetector CT imaging of the cervical spine was performed without intravenous contrast. Multiplanar CT image reconstructions were also generated. COMPARISON:  Cervical spine CT 11/20/2018. FINDINGS: Alignment: Preserved cervical lordosis. Cervicothoracic junction alignment is within normal limits. Bilateral posterior element alignment is within normal limits. Skull base and vertebrae: Osteopenia. Visualized skull base is intact. No atlanto-occipital dissociation. C1 and C2 appear intact and normally aligned. No acute osseous abnormality identified. Soft tissues and spinal canal: No prevertebral fluid or swelling. No visible canal hematoma. Chronic severe right carotid bifurcation calcified plaque. Bulky left side cervical carotid calcified plaque also. Disc levels: Mild for age cervical spine degeneration. Chronic disc and endplate degeneration maximal at C6-C7. Upper chest: Stable, negative. IMPRESSION: No acute traumatic injury identified in the cervical spine. Mild for age cervical spine degeneration. Electronically Signed   By: Odessa Fleming M.D.   On: 03/21/2021 11:06   CT Maxillofacial WO CM  Result Date: 03/21/2021 CLINICAL DATA:  80 year old female status post fall. Right forehead laceration. EXAM: CT MAXILLOFACIAL WITHOUT CONTRAST TECHNIQUE: Multidetector CT imaging of the maxillofacial structures was performed. Multiplanar CT image reconstructions were also generated. COMPARISON:  Head CT today reported separately. FINDINGS: Osseous: Motion artifact through the mandible on the initial images, repeated on series 5. Mandible appears intact and normally located. No maxilla, zygoma, or pterygoid fracture. Central  skull base appears intact with osteopenia. No nasal bone fracture identified. Orbits: No orbital wall fracture. Mild right lateral periorbital soft tissue hematoma or contusion on series eight image 20. Globes and intra orbital soft tissues remain normal. Sinuses: Clear, only trace sinus mucosal thickening. Tympanic cavities and mastoids are clear. Soft tissues: Bulky calcified carotid atherosclerosis in the neck especially on the right side. Otherwise negative visible noncontrast deep soft tissue spaces of the face and neck. Limited intracranial: Reported separately today. IMPRESSION: 1. Mild right lateral periorbital soft tissue hematoma or contusion. 2. No facial fracture or other acute traumatic injury identified. 3. Severe calcified atherosclerosis of the right carotid bifurcation. Electronically Signed   By: Odessa Fleming M.D.   On: 03/21/2021 11:03     Assessment/Plan Principal Problem:   Hip fracture (HCC) Active Problems:   Hypothyroidism   Lupus (HCC)   Vascular dementia with behavior disturbance (HCC)   # Acute right femoral neck fracture Ortho has seen and discussed with patient's son who is HCPOA, plan to proceed to surgery tomorrow. Pain appears controlled. - npo at midnight - norco/morphine for pain control  # Subdural hematoma Small, seen on CT. EDP discussed w/ neurosurgery  who advised monitoring neurologic status, no further intervention if no neurologic changes.  # Laceration R eyebrow, CT face neg for fracture, hemostatic - local wound care  # Hypothyroid - cont home synthroid  # Dementia with behavioral disturbance - cont home depakote, haldol, lorazepam, zoloft - delirium precautions - slp swallow eval  # Lupus Appears quiescent  # CHF? Saw [facility] cardiology in 2018, given provisional dx of chf based on x-ray findings. TTE and nuclear stress tests ordered, results not available, son says that w/u was unremarkable, no signs fluid overload on exam - monitor  DVT  prophylaxis: SCDs Code Status: DNR confirmed w/ patient's son  Family Communication: son updated telephonically 6/20  Consults called: orthopedics   Level of care: inpatient    Silvano Bilis MD [facility] Pager [phone]  If 7PM-7AM, please contact night-coverage [email or web address] Password [email or web address]  03/21/2021, 1:50 PM

## 2021-03-21 NOTE — ED Provider Notes (Addendum)
[facility] Provider Note   CSN: [id number] Arrival date & time: 03/21/21  0831     History Chief Complaint  Patient presents with   Marletta LorFall    Tamara Fisher is a 80 y.o. female.  HPI This patient arrives from a local nursing facility with EMS providers due to a fall. Patient has dementia, behavioral disturbance, level 5 caveat. History is obtained by EMS providers. Reportedly the patient was in her usual state of health, per nursing home staff, when she had a witnessed mechanical fall.  No reported loss of consciousness. Patient has been interacting in a typical manner since the fall.  However, the patient has had an open wound in the right face, with active bleeding.  She was uncooperative in route, removing her cervical spine collar.  Patient cannot provide any details of what hurts, curses at staff, attempts to punch staff during initial interaction.     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   OB History   No obstetric history on file.     Family History  Problem Relation Age of Onset   Hypertension Other     Social History   Tobacco Use   Smoking status: Never   Smokeless tobacco: Never  Vaping Use   Vaping Use: Never used  Substance Use Topics   Alcohol use: No   Drug use: No     Home Medications Prior to Admission medications   Medication Sig Start Date End Date Taking? Authorizing Provider  acetaminophen (TYLENOL) 500 MG tablet Take 500 mg by mouth 2 (two) times daily. MAY ALSO TAKE AN EXTRA TABLET DAILY FOR BREAKTHROUGH PAIN    [provider]  cholecalciferol (VITAMIN D) 1000 units tablet Take 1,000 Units by mouth daily.    [provider]  divalproex (DEPAKOTE SPRINKLE) 125 MG capsule Take 250 mg by mouth 3 (three) times daily.     [provider]  guaiFENesin (ROBITUSSIN) 100 MG/5ML SOLN Take 20 mLs by mouth every 6 (six) hours as needed for cough or to loosen phlegm.    [provider]  levothyroxine (SYNTHROID, LEVOTHROID) 75 MCG tablet Take 1 tablet (75 mcg total) by mouth daily before breakfast. 08/23/17   Short, Thea SilversmithMackenzie, MD  LORazepam (ATIVAN) 0.5 MG tablet Take 1 tablet (0.5 mg total) by mouth 2 (two) times daily. Patient taking differently: Take 0.25 mg by mouth 2 (two) times daily.  07/25/18   Sharee HolsterGreen, Deborah S, NP  Melatonin 3 MG TBDP Take 6 mg by mouth at bedtime.     [provider]  NON FORMULARY Diet type: Regular diet - regular texture    [provider]  ondansetron (ZOFRAN) 4 MG tablet Take 4 mg by mouth every 4 (four) hours as needed for nausea.     [provider]  sertraline (ZOLOFT) 100 MG tablet Take 100 mg by mouth daily.     [provider]  traZODone (DESYREL) 50 MG tablet Take 25 mg by mouth at bedtime.    [provider]  OLANZapine (ZYPREXA) 10 MG tablet Take 10 mg by mouth at bedtime.   03/21/21  [provider]    Allergies    Tetracyclines & related and Sulfa antibiotics  Review of Systems   Review of Systems  Unable to perform ROS: Dementia   Physical Exam Updated Vital Signs BP (!) 133/93   Pulse 81   Temp 97.6 F (36.4 C) (Oral)   Resp 14   Ht 5\' 6"  (1.676 m)   Wt 68 kg   SpO2 100%   BMI 24.21 kg/m   Physical Exam Vitals  and nursing note reviewed.  Constitutional:      Appearance: She is well-developed.     Comments: elderly female awake and alert moving all extremities, not following commands.  She is combative, attempting to punch staff, swearing at people.  HENT:     Head: Normocephalic.   Eyes:     Conjunctiva/sclera: Conjunctivae normal.  Cardiovascular:     Rate and Rhythm: Normal rate and regular rhythm.     Pulses: Normal pulses.  Pulmonary:     Effort: Pulmonary effort is normal. No respiratory distress.     Breath sounds: No stridor.  Abdominal:     General: There is no distension.  Musculoskeletal:     Comments: Moves all extremities spontaneously, has no gross deformity, but repetitively moves her hand towards her right hip and is tender in this area with palpation.  Skin:    General: Skin is warm and dry.  Neurological:     Mental Status: She is alert.     Cranial Nerves: No cranial nerve deficit.     Comments: Moves all extremities spontaneously, actively trying to punch staff.  She does not reliably follow commands, but has no gross neurologic dysfunction beyond disorientation  Psychiatric:        Behavior: Behavior is combative.        Cognition and Memory: Cognition is impaired. Memory is impaired.    ED Results / Procedures / Treatments   Labs (all labs ordered are listed, but only abnormal results are displayed) Labs Reviewed  BASIC METABOLIC PANEL - Abnormal; Notable for the following components:      Result Value   Glucose, Bld 122 (*)    All other components within normal limits  CBC WITH DIFFERENTIAL/PLATELET - Abnormal; Notable for the following components:   Neutro Abs 8.8 (*)    Lymphs Abs 0.6 (*)    All other components within normal limits  RESP PANEL BY RT-PCR (FLU A&B, COVID) [id number]    EKG None  Radiology DG Pelvis 1-2 Views  Result Date: 03/21/2021 CLINICAL DATA:  Acute RIGHT hip pain following fall. Initial encounter. EXAM: PELVIS -  1-2 VIEW COMPARISON:  None. FINDINGS: A RIGHT femoral neck fracture is noted with approximately 1.5 cm SUPERIOR displacement. There is no evidence of dislocation. No other fractures are identified. IMPRESSION: RIGHT femoral neck fracture with approximately 1.5 cm SUPERIOR displacement. Electronically Signed   By: 03/23/2021 M.D.   On: 03/21/2021 10:10   CT Head Wo Contrast  Result Date: 03/21/2021 CLINICAL DATA:  80 year old female status post fall. Right forehead laceration. EXAM: CT HEAD WITHOUT CONTRAST TECHNIQUE: Contiguous axial images were obtained from the base of the  skull through the vertex without intravenous contrast. COMPARISON:  Head CT 09/12/2018. Face and cervical spine CT today reported separately. FINDINGS: Brain: No midline shift, mass effect, or evidence of intracranial mass lesion. No ventriculomegaly. Confluent bilateral deep white matter hypodensity appears stable since 2019 compatible with chronic small vessel disease. Chronic thickening of the falx is noted, but at the level of the pericallosal sulcus thickness and density appear increased compared to 2019 suspicious for trace para falcine subdural blood (series 6, image 49). But no other intracranial hemorrhage is identified. No cortically based acute infarct identified. No suspicious intracranial vascular hyperdensity. Vascular: Calcified atherosclerosis at the skull base. Skull: No fracture identified.  Stable hyperostosis frontalis. Sinuses/Orbits: Visualized paranasal sinuses and mastoids are stable and well aerated. Other: Mild right forehead scalp hematoma or contusion on series 4, image 29. No scalp soft tissue gas. Visualized orbit soft tissues are within normal limits. IMPRESSION: 1. Appearance suspicious for trace parafalcine subdural blood. 2. No intracranial mass effect or other acute traumatic injury to the brain identified. Chronic small vessel disease. 3. Mild right forehead scalp hematoma or contusion without underlying  skull fracture. Electronically Signed   By: Odessa Fleming M.D.   On: 03/21/2021 10:59   CT Cervical Spine Wo Contrast  Result Date: 03/21/2021 CLINICAL DATA:  80 year old female status post fall. Right forehead laceration. EXAM: CT CERVICAL SPINE WITHOUT CONTRAST TECHNIQUE: Multidetector CT imaging of the cervical spine was performed without intravenous contrast. Multiplanar CT image reconstructions were also generated. COMPARISON:  Cervical spine CT 11/20/2018. FINDINGS: Alignment: Preserved cervical lordosis. Cervicothoracic junction alignment is within normal limits. Bilateral posterior element alignment is within normal limits. Skull base and vertebrae: Osteopenia. Visualized skull base is intact. No atlanto-occipital dissociation. C1 and C2 appear intact and normally aligned. No acute osseous abnormality identified. Soft tissues and spinal canal: No prevertebral fluid or swelling. No visible canal hematoma. Chronic severe right carotid bifurcation calcified plaque. Bulky left side cervical carotid calcified plaque also. Disc levels: Mild for age cervical spine degeneration. Chronic disc and endplate degeneration maximal at C6-C7. Upper chest: Stable, negative. IMPRESSION: No acute traumatic injury identified in the cervical spine. Mild for age cervical spine degeneration. Electronically Signed   By: Odessa Fleming M.D.   On: 03/21/2021 11:06   CT Maxillofacial WO CM  Result Date: 03/21/2021 CLINICAL DATA:  80 year old female status post fall. Right forehead laceration. EXAM: CT MAXILLOFACIAL WITHOUT CONTRAST TECHNIQUE: Multidetector CT imaging of the maxillofacial structures was performed. Multiplanar CT image reconstructions were also generated. COMPARISON:  Head CT today reported separately. FINDINGS: Osseous: Motion artifact through the mandible on the initial images, repeated on series 5. Mandible appears intact and normally located. No maxilla, zygoma, or pterygoid fracture. Central skull base appears intact  with osteopenia. No nasal bone fracture identified. Orbits: No orbital wall fracture. Mild right lateral periorbital soft tissue hematoma or contusion on series eight image 20. Globes and intra orbital soft tissues remain normal. Sinuses: Clear, only trace sinus mucosal thickening. Tympanic cavities and mastoids are clear. Soft tissues: Bulky calcified carotid atherosclerosis in the neck especially on the right side. Otherwise negative visible noncontrast deep soft tissue spaces of the face and neck. Limited intracranial: Reported separately today. IMPRESSION: 1. Mild right lateral periorbital soft tissue hematoma or contusion. 2. No facial fracture or other acute traumatic injury identified. 3. Severe calcified atherosclerosis of the right carotid bifurcation. Electronically Signed   By: Odessa Fleming M.D.   On: 03/21/2021 11:03    Procedures Procedures  Medications Ordered in ED Medications  LORazepam (ATIVAN) injection 1 mg (has no administration in time range)  chlorhexidine (HIBICLENS) 4 % liquid 4 application (has no administration in time range)  povidone-iodine 10 % swab 2 application (has no administration in time range)  ceFAZolin (ANCEF) IVPB 2g/100 mL premix (has no administration in time range)  tranexamic acid (CYKLOKAPRON) IVPB 1,000 mg (has no administration in time range)  LORazepam (ATIVAN) injection 1 mg (1 mg Intramuscular Given 03/21/21 6213)    ED Course  I have reviewed the triage vital signs and the nursing notes.  Pertinent labs & imaging results that were available during my care of the patient were reviewed by me and considered in my medical decision making (see chart for details).  Update:, After receiving intramuscular Ativan the patient was substantially [place], was able to have CT head, neck, face.  Update: Patient has tolerated wound repair.   LACERATION REPAIR Performed by: Gerhard Munch Authorized by: Gerhard Munch Consent: Verbal consent obtained. Risks  and benefits: risks, benefits and alternatives were discussed Consent given by: patient Patient identity confirmed: provided demographic data Prepped and Draped in normal sterile fashion Wound explored  Laceration Location: R face  Laceration Length: 4cm  No Foreign Bodies seen or palpated   Irrigation method: syringe Amount of cleaning: standard  Skin closure: dermabond  Number of tubes:1  Technique: close  Patient tolerance: Patient tolerated the procedure well with no immediate complications.  2:23 PM I have discussed the patient with our orthopedic colleague, Dr. Ave Filter who will facilitate repair of the patient's hip fracture. Subsequently discussed patient's case with our neurosurgeon, Dr. Yetta Barre given concern for parafalcine blood post trauma.  No indication for repeat imaging unless the patient has change in clinical condition. I discussed patient's case with her son, who consents for admission, surgery, is available via telephone.  No discussed patient's case with our internal medicine colleagues for admission.   MDM Rules/Calculators/A&P MDM Number of Diagnoses or Management Options Acute intra-cranial hemorrhage [facility]): new, needed workup Closed fracture of right hip, initial encounter [facility]): new, needed workup Facial laceration, initial encounter: new, needed workup Fall, initial encounter: new, needed workup   Amount and/or Complexity of Data Reviewed Clinical lab tests: ordered and reviewed Tests in the radiology section of CPT: ordered and reviewed Tests in the medicine section of CPT: reviewed and ordered Decide to obtain previous medical records or to obtain history from someone other than the patient: yes Obtain history from someone other than the patient: yes Review and summarize past medical records: yes Discuss the patient with other providers: yes Independent visualization of images, tracings, or specimens: yes  Risk of Complications,  Morbidity, and/or Mortality Presenting problems: high Diagnostic procedures: high Management options: high  Critical Care Total time providing critical care: 30-74 minutes (45)  Patient Progress Patient progress: stable   Final Clinical Impression(s) / ED Diagnoses Final diagnoses:  Fall, initial encounter  Closed fracture of right hip, initial encounter (HCC)  Facial laceration, initial encounter  Acute intra-cranial hemorrhage (HCC)     Gerhard Munch, MD 03/21/21 1428    Gerhard Munch, MD 03/21/21 1429

## 2021-03-21 NOTE — Progress Notes (Signed)
Spoke with son, POA.  Right femoral neck fracture. Planned for surgery tomorrow.   The risks benefits and alternatives were discussed with the patient including but not limited to the risks of nonoperative treatment, versus surgical intervention including infection, bleeding, nerve injury, periprosthetic fracture, the need for revision surgery, dislocation, leg length discrepancy, blood clots, cardiopulmonary complications, morbidity, mortality, among others, and they were willing to proceed.    Full consult to follow.   Eulas Post, MD

## 2021-03-21 NOTE — Progress Notes (Signed)
Patient ID: Tamara Fisher, female   DOB: [birth year], 80 y.o.   MRN: [id number]  Orthopedics aware of pt, plan hemiarthroplasty tomorrow by Dr. Dion Saucier. Please keep NPO after MN.    Freeman Caldron, PA-C Orthopedic Surgery [phone]

## 2021-03-21 NOTE — ED Notes (Signed)
Family updated as to patient's status.

## 2021-03-21 NOTE — ED Notes (Signed)
ED Provider at bedside. 

## 2021-03-21 NOTE — Consult Note (Signed)
Reason for Consult:Right hip fx Referring Physician: Shonna ChockNoah Wouk Time called: 1055 Time at bedside: 1458   Tamara Fisher is an 80 y.o. female.  HPI: [facility]ara [facility] the [facility]. She was brought to the ED where x-rays showed a right femoral neck fx and orthopedic surgery was consulted. She has advanced dementia and could not contribute to history.  Past Medical History:  Diagnosis Date   Aneurysm of left carotid artery (HCC)    Dementia (HCC)    Hypothyroidism    Lupus (HCC)     History reviewed. No pertinent surgical history.  Family History  Problem Relation Age of Onset   Hypertension Other     Social History:  reports that she has never smoked. She has never used smokeless tobacco. She reports that she does not drink alcohol and does not use drugs.  Allergies:  Allergies  Allergen Reactions   Tetracyclines & Related Nausea And Vomiting   Sulfa Antibiotics Rash    Medications: I have reviewed the patient's current medications.  Results for orders placed or performed during the hospital encounter of 03/21/21 (from the past 48 hour(s))  Resp Panel by RT-PCR (Flu A&B, Covid) Nasopharyngeal Swab     Status: None   Collection Time: 03/21/21 10:17 AM   Specimen: Nasopharyngeal Swab; Nasopharyngeal(NP) swabs in vial transport medium  Result Value Ref Range   SARS Coronavirus 2 by RT PCR NEGATIVE NEGATIVE    Comment: (NOTE) SARS-CoV-2 target nucleic acids are NOT DETECTED.  The SARS-CoV-2 RNA is generally detectable in upper respiratory specimens during the acute phase of infection. The lowest concentration of SARS-CoV-2 viral copies this assay can detect is 138 copies/mL. A negative result does not preclude SARS-Cov-2 infection and should not be used as the sole basis for treatment or other patient management decisions. A negative result may occur with  improper specimen collection/handling, submission of specimen other than nasopharyngeal swab, presence  of viral mutation(s) within the areas targeted by this assay, and inadequate number of viral copies(<138 copies/mL). A negative result must be combined with clinical observations, patient history, and epidemiological information. The expected result is Negative.  Fact Sheet for Patients:  BloggerCourse.comhttps://www.fda.gov/media/152166/download  Fact Sheet for Healthcare Providers:  SeriousBroker.ithttps://www.fda.gov/media/152162/download  This test is no t yet approved or cleared by the [place] FDA and  has been authorized for detection and/or diagnosis of SARS-CoV-2 by FDA under an Emergency Use Authorization (EUA). This EUA will remain  in effect (meaning this test can be used) for the duration of the COVID-19 declaration under Section 564(b)(1) of the Act, 21 U.S.C.section 360bbb-3(b)(1), unless the authorization is terminated  or revoked sooner.       Influenza A by PCR NEGATIVE NEGATIVE   Influenza B by PCR NEGATIVE NEGATIVE    Comment: (NOTE) The Xpert Xpress SARS-CoV-2/FLU/RSV plus assay is intended as an aid in the diagnosis of influenza from Nasopharyngeal swab specimens and should not be used as a sole basis for treatment. Nasal washings and aspirates are unacceptable for Xpert Xpress SARS-CoV-2/FLU/RSV testing.  Fact Sheet for Patients: BloggerCourse.comhttps://www.fda.gov/media/152166/download  Fact Sheet for Healthcare Providers: SeriousBroker.ithttps://www.fda.gov/media/152162/download  This test is not yet approved or cleared by the [place] FDA and has been authorized for detection and/or diagnosis of SARS-CoV-2 by FDA under an Emergency Use Authorization (EUA). This EUA will remain in effect (meaning this test can be used) for the duration of the COVID-19 declaration under Section 564(b)(1) of the Act, 21 U.S.C. section 360bbb-3(b)(1), unless the authorization is  terminated or revoked.  Performed at [facility], [address] [place], [place] [address]   Basic metabolic panel      Status: Abnormal   Collection Time: 03/21/21 10:18 AM  Result Value Ref Range   Sodium 139 135 - 145 mmol/L   Potassium 4.3 3.5 - 5.1 mmol/L   Chloride 102 98 - 111 mmol/L   CO2 29 22 - 32 mmol/L   Glucose, Bld 122 (H) 70 - 99 mg/dL    Comment: Glucose reference range applies only to samples taken after fasting for at least 8 hours.   BUN 20 8 - 23 mg/dL   Creatinine, Ser 2.44 0.44 - 1.00 mg/dL   Calcium 9.5 8.9 - 01.0 mg/dL   GFR, Estimated >27 >25 mL/min    Comment: (NOTE) Calculated using the CKD-EPI Creatinine Equation (2021)    Anion gap 8 5 - 15    Comment: Performed at [facility], [address] [place], [place] [address]  CBC with Differential     Status: Abnormal   Collection Time: 03/21/21 10:18 AM  Result Value Ref Range   WBC 10.1 4.0 - 10.5 K/uL   RBC 4.54 3.87 - 5.11 MIL/uL   Hemoglobin 14.0 12.0 - 15.0 g/dL   HCT 03.4 74.2 - 59.5 %   MCV 94.9 80.0 - 100.0 fL   MCH 30.8 26.0 - 34.0 pg   MCHC 32.5 30.0 - 36.0 g/dL   RDW 63.8 75.6 - 43.3 %   Platelets 157 150 - 400 K/uL   nRBC 0.0 0.0 - 0.2 %   Neutrophils Relative % 88 %   Neutro Abs 8.8 (H) 1.7 - 7.7 K/uL   Lymphocytes Relative 6 %   Lymphs Abs 0.6 (L) 0.7 - 4.0 K/uL   Monocytes Relative 6 %   Monocytes Absolute 0.6 0.1 - 1.0 K/uL   Eosinophils Relative 0 %   Eosinophils Absolute 0.0 0.0 - 0.5 K/uL   Basophils Relative 0 %   Basophils Absolute 0.0 0.0 - 0.1 K/uL   Immature Granulocytes 0 %   Abs Immature Granulocytes 0.03 0.00 - 0.07 K/uL    Comment: Performed at [facility], [address], [place], [place] [address]    DG Pelvis 1-2 Views  Result Date: 03/21/2021 CLINICAL DATA:  Acute RIGHT hip pain following fall. Initial encounter. EXAM: PELVIS - 1-2 VIEW COMPARISON:  None. FINDINGS: A RIGHT femoral neck fracture is noted with approximately 1.5 cm SUPERIOR displacement. There is no evidence of dislocation. No other fractures are identified. IMPRESSION: RIGHT  femoral neck fracture with approximately 1.5 cm SUPERIOR displacement. Electronically Signed   By: Harmon Pier M.D.   On: 03/21/2021 10:10   CT Head Wo Contrast  Result Date: 03/21/2021 CLINICAL DATA:  80 year old female status post fall. Right forehead laceration. EXAM: CT HEAD WITHOUT CONTRAST TECHNIQUE: Contiguous axial images were obtained from the base of the skull through the vertex without intravenous contrast. COMPARISON:  Head CT 09/12/2018. Face and cervical spine CT today reported separately. FINDINGS: Brain: No midline shift, mass effect, or evidence of intracranial mass lesion. No ventriculomegaly. Confluent bilateral deep white matter hypodensity appears stable since 2019 compatible with chronic small vessel disease. Chronic thickening of the falx is noted, but at the level of the pericallosal sulcus thickness and density appear increased compared to 2019 suspicious for trace para falcine subdural blood (series 6, image 49). But no other intracranial hemorrhage is identified. No cortically based acute infarct  identified. No suspicious intracranial vascular hyperdensity. Vascular: Calcified atherosclerosis at the skull base. Skull: No fracture identified.  Stable hyperostosis frontalis. Sinuses/Orbits: Visualized paranasal sinuses and mastoids are stable and well aerated. Other: Mild right forehead scalp hematoma or contusion on series 4, image 29. No scalp soft tissue gas. Visualized orbit soft tissues are within normal limits. IMPRESSION: 1. Appearance suspicious for trace parafalcine subdural blood. 2. No intracranial mass effect or other acute traumatic injury to the brain identified. Chronic small vessel disease. 3. Mild right forehead scalp hematoma or contusion without underlying skull fracture. Electronically Signed   By: Odessa Fleming M.D.   On: 03/21/2021 10:59   CT Cervical Spine Wo Contrast  Result Date: 03/21/2021 CLINICAL DATA:  80 year old female status post fall. Right forehead  laceration. EXAM: CT CERVICAL SPINE WITHOUT CONTRAST TECHNIQUE: Multidetector CT imaging of the cervical spine was performed without intravenous contrast. Multiplanar CT image reconstructions were also generated. COMPARISON:  Cervical spine CT 11/20/2018. FINDINGS: Alignment: Preserved cervical lordosis. Cervicothoracic junction alignment is within normal limits. Bilateral posterior element alignment is within normal limits. Skull base and vertebrae: Osteopenia. Visualized skull base is intact. No atlanto-occipital dissociation. C1 and C2 appear intact and normally aligned. No acute osseous abnormality identified. Soft tissues and spinal canal: No prevertebral fluid or swelling. No visible canal hematoma. Chronic severe right carotid bifurcation calcified plaque. Bulky left side cervical carotid calcified plaque also. Disc levels: Mild for age cervical spine degeneration. Chronic disc and endplate degeneration maximal at C6-C7. Upper chest: Stable, negative. IMPRESSION: No acute traumatic injury identified in the cervical spine. Mild for age cervical spine degeneration. Electronically Signed   By: Odessa Fleming M.D.   On: 03/21/2021 11:06   CT Maxillofacial WO CM  Result Date: 03/21/2021 CLINICAL DATA:  80 year old female status post fall. Right forehead laceration. EXAM: CT MAXILLOFACIAL WITHOUT CONTRAST TECHNIQUE: Multidetector CT imaging of the maxillofacial structures was performed. Multiplanar CT image reconstructions were also generated. COMPARISON:  Head CT today reported separately. FINDINGS: Osseous: Motion artifact through the mandible on the initial images, repeated on series 5. Mandible appears intact and normally located. No maxilla, zygoma, or pterygoid fracture. Central skull base appears intact with osteopenia. No nasal bone fracture identified. Orbits: No orbital wall fracture. Mild right lateral periorbital soft tissue hematoma or contusion on series eight image 20. Globes and intra orbital soft  tissues remain normal. Sinuses: Clear, only trace sinus mucosal thickening. Tympanic cavities and mastoids are clear. Soft tissues: Bulky calcified carotid atherosclerosis in the neck especially on the right side. Otherwise negative visible noncontrast deep soft tissue spaces of the face and neck. Limited intracranial: Reported separately today. IMPRESSION: 1. Mild right lateral periorbital soft tissue hematoma or contusion. 2. No facial fracture or other acute traumatic injury identified. 3. Severe calcified atherosclerosis of the right carotid bifurcation. Electronically Signed   By: Odessa Fleming M.D.   On: 03/21/2021 11:03    Review of Systems  Unable to perform ROS: Dementia  Blood pressure (!) 133/93, pulse 81, temperature 97.6 F (36.4 C), temperature source Oral, resp. rate 14, height 5\' 6"  (1.676 m), weight 68 kg, SpO2 100 %. Physical Exam Constitutional:      General: She is not in acute distress.    Appearance: She is well-developed. She is not diaphoretic.  HENT:     Head: Normocephalic and atraumatic.  Eyes:     General: No scleral icterus.       Right eye: No discharge.  Left eye: No discharge.     Conjunctiva/sclera: Conjunctivae normal.  Cardiovascular:     Rate and Rhythm: Normal rate and regular rhythm.  Pulmonary:     Effort: Pulmonary effort is normal. No respiratory distress.  Musculoskeletal:     Cervical back: Normal range of motion.     Comments: RLE No traumatic wounds, ecchymosis, or rash  Mild TTP hip  No knee or ankle effusion  Knee stable to varus/ valgus and anterior/posterior stress  Sens DPN, SPN, TN grossly intact  Motor EHL, ext, flex, evers could not assess  DP 1+, PT 1+, No significant edema  Skin:    General: Skin is warm and dry.  Neurological:     Mental Status: She is alert.  Psychiatric:        Mood and Affect: Mood normal.        Behavior: Behavior normal.    Assessment/Plan: Right hip fx -- Plan hip hemi tomorrow with Dr. Dion Saucier.  Please keep NPO after MN.    Freeman Caldron, PA-C Orthopedic Surgery [phone] 03/21/2021, 3:01 PM

## 2021-03-21 NOTE — ED Triage Notes (Signed)
Pt comes in from [facility] for a fall. Staff states she is at her baseline after fall. Laceration to right forehead noted.

## 2021-03-22 ENCOUNTER — Encounter (HOSPITAL_COMMUNITY)
Admission: EM | Disposition: A | Discharge status: Skilled Nursing Facility | Payer: Self-pay | Source: Skilled Nursing Facility | Attending: Internal Medicine

## 2021-03-22 ENCOUNTER — Inpatient Hospital Stay (HOSPITAL_COMMUNITY): Payer: Medicare HMO

## 2021-03-22 ENCOUNTER — Other Ambulatory Visit: Payer: Self-pay

## 2021-03-22 ENCOUNTER — Inpatient Hospital Stay (HOSPITAL_COMMUNITY): Payer: Medicare HMO | Admitting: Certified Registered Nurse Anesthetist

## 2021-03-22 HISTORY — PX: TOTAL HIP ARTHROPLASTY: SHX124

## 2021-03-22 LAB — ABO/RH: ABO/RH(D): B POS

## 2021-03-22 LAB — TYPE AND SCREEN
ABO/RH(D): B POS
Antibody Screen: NEGATIVE

## 2021-03-22 SURGERY — ARTHROPLASTY, HIP, TOTAL,POSTERIOR APPROACH
Anesthesia: General | Site: Hip | Laterality: Right

## 2021-03-22 MED ORDER — ACETAMINOPHEN 325 MG PO TABS
325.0000 mg | ORAL_TABLET | Freq: Four times a day (QID) | ORAL | Status: DC | PRN
  Administered 2021-03-25 – 2021-04-01 (×7): 650 mg via ORAL
  Filled 2021-03-22 (×9): qty 2

## 2021-03-22 MED ORDER — PROPOFOL 10 MG/ML IV BOLUS
INTRAVENOUS | Status: AC
  Filled 2021-03-22: qty 20

## 2021-03-22 MED ORDER — ROCURONIUM BROMIDE 10 MG/ML (PF) SYRINGE
PREFILLED_SYRINGE | INTRAVENOUS | Status: AC
  Filled 2021-03-22: qty 10

## 2021-03-22 MED ORDER — PHENYLEPHRINE 40 MCG/ML (10ML) SYRINGE FOR IV PUSH (FOR BLOOD PRESSURE SUPPORT)
PREFILLED_SYRINGE | INTRAVENOUS | Status: AC
  Filled 2021-03-22: qty 10

## 2021-03-22 MED ORDER — CEFAZOLIN SODIUM-DEXTROSE 2-3 GM-%(50ML) IV SOLR
INTRAVENOUS | Status: DC | PRN
  Administered 2021-03-22: 2 g via INTRAVENOUS

## 2021-03-22 MED ORDER — POTASSIUM CHLORIDE IN NACL 20-0.45 MEQ/L-% IV SOLN
INTRAVENOUS | Status: DC
  Filled 2021-03-22 (×2): qty 1000

## 2021-03-22 MED ORDER — VANCOMYCIN HCL 1000 MG IV SOLR
INTRAVENOUS | Status: AC
  Filled 2021-03-22: qty 1000

## 2021-03-22 MED ORDER — BUPIVACAINE HCL (PF) 0.5 % IJ SOLN
INTRAMUSCULAR | Status: AC
  Filled 2021-03-22: qty 30

## 2021-03-22 MED ORDER — DOCUSATE SODIUM 100 MG PO CAPS
100.0000 mg | ORAL_CAPSULE | Freq: Two times a day (BID) | ORAL | Status: DC
  Administered 2021-03-22 – 2021-04-01 (×18): 100 mg via ORAL
  Filled 2021-03-22 (×18): qty 1

## 2021-03-22 MED ORDER — TRAMADOL HCL 50 MG PO TABS
50.0000 mg | ORAL_TABLET | Freq: Four times a day (QID) | ORAL | Status: DC | PRN
  Administered 2021-03-23 – 2021-04-01 (×22): 50 mg via ORAL
  Filled 2021-03-22 (×23): qty 1

## 2021-03-22 MED ORDER — DEXAMETHASONE SODIUM PHOSPHATE 10 MG/ML IJ SOLN
INTRAMUSCULAR | Status: AC
  Filled 2021-03-22: qty 1

## 2021-03-22 MED ORDER — PHENOL 1.4 % MT LIQD: 1.0000 | OROMUCOSAL | Status: DC | PRN

## 2021-03-22 MED ORDER — EPHEDRINE 5 MG/ML INJ
INTRAVENOUS | Status: AC
  Filled 2021-03-22: qty 10

## 2021-03-22 MED ORDER — PHENYLEPHRINE 40 MCG/ML (10ML) SYRINGE FOR IV PUSH (FOR BLOOD PRESSURE SUPPORT)
PREFILLED_SYRINGE | INTRAVENOUS | Status: DC | PRN
  Administered 2021-03-22 (×2): 80 ug via INTRAVENOUS
  Administered 2021-03-22 (×4): 120 ug via INTRAVENOUS

## 2021-03-22 MED ORDER — FENTANYL CITRATE (PF) 100 MCG/2ML IJ SOLN
INTRAMUSCULAR | Status: AC
  Filled 2021-03-22: qty 2

## 2021-03-22 MED ORDER — SENNA 8.6 MG PO TABS
1.0000 | ORAL_TABLET | Freq: Two times a day (BID) | ORAL | Status: DC
  Administered 2021-03-22 – 2021-04-01 (×19): 8.6 mg via ORAL
  Filled 2021-03-22 (×19): qty 1

## 2021-03-22 MED ORDER — LIDOCAINE 2% (20 MG/ML) 5 ML SYRINGE
INTRAMUSCULAR | Status: DC | PRN
  Administered 2021-03-22: 50 mg via INTRAVENOUS

## 2021-03-22 MED ORDER — ONDANSETRON HCL 4 MG/2ML IJ SOLN
INTRAMUSCULAR | Status: DC | PRN
  Administered 2021-03-22: 4 mg via INTRAVENOUS

## 2021-03-22 MED ORDER — EPHEDRINE SULFATE-NACL 50-0.9 MG/10ML-% IV SOSY
PREFILLED_SYRINGE | INTRAVENOUS | Status: DC | PRN
  Administered 2021-03-22 (×2): 5 mg via INTRAVENOUS

## 2021-03-22 MED ORDER — PROPOFOL 10 MG/ML IV BOLUS
INTRAVENOUS | Status: DC | PRN
  Administered 2021-03-22: 100 mg via INTRAVENOUS

## 2021-03-22 MED ORDER — MAGNESIUM CITRATE PO SOLN: 1.0000 | Freq: Once | ORAL | Status: DC | PRN

## 2021-03-22 MED ORDER — ENOXAPARIN SODIUM 40 MG/0.4ML IJ SOSY
40.0000 mg | PREFILLED_SYRINGE | INTRAMUSCULAR | Status: DC
  Administered 2021-03-23 – 2021-04-01 (×10): 40 mg via SUBCUTANEOUS
  Filled 2021-03-22 (×10): qty 0.4

## 2021-03-22 MED ORDER — FENTANYL CITRATE (PF) 100 MCG/2ML IJ SOLN
INTRAMUSCULAR | Status: DC | PRN
  Administered 2021-03-22 (×2): 50 ug via INTRAVENOUS
  Administered 2021-03-22 (×2): 25 ug via INTRAVENOUS
  Administered 2021-03-22: 50 ug via INTRAVENOUS

## 2021-03-22 MED ORDER — MENTHOL 3 MG MT LOZG: 1.0000 | LOZENGE | OROMUCOSAL | Status: DC | PRN

## 2021-03-22 MED ORDER — ONDANSETRON HCL 4 MG/2ML IJ SOLN
INTRAMUSCULAR | Status: AC
  Filled 2021-03-22: qty 2

## 2021-03-22 MED ORDER — LACTATED RINGERS IV SOLN: INTRAVENOUS | Status: DC

## 2021-03-22 MED ORDER — TRANEXAMIC ACID-NACL 1000-0.7 MG/100ML-% IV SOLN
1000.0000 mg | Freq: Once | INTRAVENOUS | Status: AC
  Administered 2021-03-22: 1000 mg via INTRAVENOUS
  Filled 2021-03-22: qty 100

## 2021-03-22 MED ORDER — CEFAZOLIN SODIUM-DEXTROSE 2-4 GM/100ML-% IV SOLN
2.0000 g | Freq: Four times a day (QID) | INTRAVENOUS | Status: AC
  Administered 2021-03-22 (×2): 2 g via INTRAVENOUS
  Filled 2021-03-22 (×2): qty 100

## 2021-03-22 MED ORDER — DEXAMETHASONE SODIUM PHOSPHATE 4 MG/ML IJ SOLN
INTRAMUSCULAR | Status: DC | PRN
  Administered 2021-03-22: 5 mg via INTRAVENOUS

## 2021-03-22 MED ORDER — ALUM & MAG HYDROXIDE-SIMETH 200-200-20 MG/5ML PO SUSP: 30.0000 mL | ORAL | Status: DC | PRN

## 2021-03-22 MED ORDER — LIDOCAINE 2% (20 MG/ML) 5 ML SYRINGE
INTRAMUSCULAR | Status: AC
  Filled 2021-03-22: qty 5

## 2021-03-22 MED ORDER — FERROUS SULFATE 325 (65 FE) MG PO TABS
325.0000 mg | ORAL_TABLET | Freq: Three times a day (TID) | ORAL | Status: DC
  Administered 2021-03-23 – 2021-03-24 (×6): 325 mg via ORAL
  Filled 2021-03-22 (×6): qty 1

## 2021-03-22 MED ORDER — ONDANSETRON HCL 4 MG/2ML IJ SOLN: 4.0000 mg | Freq: Four times a day (QID) | INTRAMUSCULAR | Status: DC | PRN

## 2021-03-22 MED ORDER — BISACODYL 10 MG RE SUPP
10.0000 mg | Freq: Every day | RECTAL | Status: DC | PRN
  Filled 2021-03-22: qty 1

## 2021-03-22 MED ORDER — BUPIVACAINE HCL (PF) 0.5 % IJ SOLN
INTRAMUSCULAR | Status: DC | PRN
  Administered 2021-03-22: 30 mL

## 2021-03-22 MED ORDER — ONDANSETRON HCL 4 MG PO TABS: 4.0000 mg | ORAL_TABLET | Freq: Four times a day (QID) | ORAL | Status: DC | PRN

## 2021-03-22 MED ORDER — ROCURONIUM BROMIDE 10 MG/ML (PF) SYRINGE
PREFILLED_SYRINGE | INTRAVENOUS | Status: DC | PRN
  Administered 2021-03-22: 50 mg via INTRAVENOUS

## 2021-03-22 MED ORDER — SUGAMMADEX SODIUM 200 MG/2ML IV SOLN
INTRAVENOUS | Status: DC | PRN
  Administered 2021-03-22: 100 mg via INTRAVENOUS

## 2021-03-22 MED ORDER — ACETAMINOPHEN 500 MG PO TABS
500.0000 mg | ORAL_TABLET | Freq: Four times a day (QID) | ORAL | Status: AC
  Administered 2021-03-22 – 2021-03-23 (×4): 500 mg via ORAL
  Filled 2021-03-22 (×4): qty 1

## 2021-03-22 SURGICAL SUPPLY — 53 items
BAG COUNTER SPONGE SURGICOUNT (BAG) IMPLANT
BAG SURGICOUNT SPONGE COUNTING (BAG)
BIT DRILL 2.0X128 (BIT) ×2 IMPLANT
BIT DRILL 2.0X128MM (BIT) ×1
BLADE SAW SAG 73X25 THK (BLADE) ×2
BLADE SAW SGTL 73X25 THK (BLADE) ×1 IMPLANT
CLOSURE WOUND 1/4X4 (GAUZE/BANDAGES/DRESSINGS) ×2
COVER SURGICAL LIGHT HANDLE (MISCELLANEOUS) ×3 IMPLANT
DRAPE INCISE IOBAN 66X45 STRL (DRAPES) IMPLANT
DRAPE ORTHO SPLIT 77X108 STRL (DRAPES) ×4
DRAPE POUCH INSTRU U-SHP 10X18 (DRAPES) ×3 IMPLANT
DRAPE SURG ORHT 6 SPLT 77X108 (DRAPES) ×2 IMPLANT
DRAPE U-SHAPE 47X51 STRL (DRAPES) ×3 IMPLANT
DRSG MEPILEX BORDER 4X8 (GAUZE/BANDAGES/DRESSINGS) ×3 IMPLANT
DURAPREP 26ML APPLICATOR (WOUND CARE) ×3 IMPLANT
ELECT REM PT RETURN 15FT ADLT (MISCELLANEOUS) ×3 IMPLANT
FACESHIELD WRAPAROUND (MASK) ×3 IMPLANT
GLOVE SRG 8 PF TXTR STRL LF DI (GLOVE) ×1 IMPLANT
GLOVE SURG ENC MOIS LTX SZ6.5 (GLOVE) ×6 IMPLANT
GLOVE SURG ENC MOIS LTX SZ7.5 (GLOVE) ×3 IMPLANT
GLOVE SURG UNDER POLY LF SZ7 (GLOVE) ×3 IMPLANT
GLOVE SURG UNDER POLY LF SZ8 (GLOVE) ×2
GOWN STRL REUS W/TWL LRG LVL3 (GOWN DISPOSABLE) ×6 IMPLANT
HANDPIECE INTERPULSE COAX TIP (DISPOSABLE)
HEAD FEM UNIPOLAR 46 OD STRL (Hips) ×3 IMPLANT
HOOD PEEL AWAY FLYTE STAYCOOL (MISCELLANEOUS) ×9 IMPLANT
KIT BASIN OR (CUSTOM PROCEDURE TRAY) ×3 IMPLANT
KIT TURNOVER KIT A (KITS) ×3 IMPLANT
NEEDLE HYPO 22GX1.5 SAFETY (NEEDLE) ×3 IMPLANT
NS IRRIG 1000ML POUR BTL (IV SOLUTION) ×3 IMPLANT
PACK TOTAL JOINT (CUSTOM PROCEDURE TRAY) ×3 IMPLANT
PENCIL SMOKE EVACUATOR (MISCELLANEOUS) IMPLANT
PROTECTOR NERVE ULNAR (MISCELLANEOUS) ×3 IMPLANT
RETRIEVER SUT HEWSON (MISCELLANEOUS) ×3 IMPLANT
SET HNDPC FAN SPRY TIP SCT (DISPOSABLE) IMPLANT
SPACER DEPUY (Hips) ×3 IMPLANT
SPONGE LAP 4X18 RFD (DISPOSABLE) ×3 IMPLANT
STEM SUMMIT BASIC PRESSFIT SZ4 (Hips) ×3 IMPLANT
STRIP CLOSURE SKIN 1/4X4 (GAUZE/BANDAGES/DRESSINGS) ×4 IMPLANT
SUT FIBERWIRE #2 38 REV NDL BL (SUTURE) ×9
SUT VIC AB 0 CT1 36 (SUTURE) ×3 IMPLANT
SUT VIC AB 1 CT1 27 (SUTURE)
SUT VIC AB 1 CT1 27XBRD ANTBC (SUTURE) IMPLANT
SUT VIC AB 2-0 CT1 27 (SUTURE) ×4
SUT VIC AB 2-0 CT1 TAPERPNT 27 (SUTURE) ×2 IMPLANT
SUT VIC AB 3-0 SH 8-18 (SUTURE) ×3 IMPLANT
SUTURE FIBERWR#2 38 REV NDL BL (SUTURE) ×3 IMPLANT
SYR 20ML LL LF (SYRINGE) ×3 IMPLANT
TOWEL OR 17X26 10 PK STRL BLUE (TOWEL DISPOSABLE) ×3 IMPLANT
TOWER CARTRIDGE SMART MIX (DISPOSABLE) IMPLANT
TRAY FOLEY MTR SLVR 14FR STAT (SET/KITS/TRAYS/PACK) ×3 IMPLANT
TRAY FOLEY MTR SLVR 16FR STAT (SET/KITS/TRAYS/PACK) IMPLANT
WATER STERILE IRR 1000ML POUR (IV SOLUTION) ×6 IMPLANT

## 2021-03-22 NOTE — Anesthesia Preprocedure Evaluation (Addendum)
Anesthesia Evaluation  Patient identified by MRN, date of birth, ID band Patient confused    Reviewed: Allergy & Precautions, NPO status , Patient's Chart, lab work & pertinent test results  History of Anesthesia Complications Negative for: history of anesthetic complications  Airway   TM Distance: >3 FB    Comment: Unable to comply with airway exam given mental status  Dental  (+) Dental Advisory Given   Pulmonary    Pulmonary exam normal        Cardiovascular + Peripheral Vascular Disease and +CHF  Normal cardiovascular exam     Neuro/Psych PSYCHIATRIC DISORDERS Depression Schizophrenia Dementia    GI/Hepatic   Endo/Other  Hypothyroidism   Renal/GU      Musculoskeletal   Abdominal   Peds  Hematology   Anesthesia Other Findings Lupus  Reproductive/Obstetrics                            Anesthesia Physical Anesthesia Plan  ASA: 4  Anesthesia Plan: General   Post-op Pain Management:    Induction: Intravenous  PONV Risk Score and Plan: 3 and Treatment may vary due to age or medical condition and Ondansetron  Airway Management Planned: Oral ETT  Additional Equipment: None  Intra-op Plan:   Post-operative Plan: Extubation in OR  Informed Consent: I have reviewed the patients History and Physical, chart, labs and discussed the procedure including the risks, benefits and alternatives for the proposed anesthesia with the patient or authorized representative who has indicated his/her understanding and acceptance.   Patient has DNR.  Discussed DNR with power of attorney.   Consent reviewed with POA  Plan Discussed with: CRNA and Anesthesiologist  Anesthesia Plan Comments: (Obtained history from and discussed consent and DNR with patient's son, Barbara Cower, via telephone number listed in chart. Son states we are ok to proceed with general anesthesia and place an endotracheal tube for  this procedure. We are free to provide fluids and medications as needed for hemodynamic support. However, he states that under no circumstance would she want chest compressions. Thus, partial reversal of DNR status for the procedure.)      Anesthesia Quick Evaluation

## 2021-03-22 NOTE — Op Note (Signed)
03/21/2021 - 03/22/2021  12:43 PM  PATIENT:  Tamara Fisher   MRN: [id number]  PRE-OPERATIVE DIAGNOSIS: Displaced right femoral neck fracture  POST-OPERATIVE DIAGNOSIS: Displaced right femoral neck fracture  PROCEDURE:  Procedure(s): TOTAL HIP ARTHROPLASTY, POSTERIOR HEMI  PREOPERATIVE INDICATIONS:  Tamara Fisher is an 80 y.o. female who was admitted 03/21/2021 with a diagnosis of Hip fracture ([place]) and elected for surgical management.  The risks benefits and alternatives were discussed with the patient including but not limited to the risks of nonoperative treatment, versus surgical intervention including infection, bleeding, nerve injury, periprosthetic fracture, the need for revision surgery, dislocation, leg length discrepancy, blood clots, cardiopulmonary complications, morbidity, mortality, among others, and they were willing to proceed.  Predicted outcome is good, although there will be at least a six to nine month expected recovery.  Informed consent was performed from her son, the power of attorney.  OPERATIVE REPORT     SURGEON:  Marchia Bond, MD    ASSISTANT:  Merlene Pulling, PA-C,   (Present throughout the entire procedure,  necessary for completion of procedure in a timely manner, assisting with retraction, instrumentation, and closure)     ANESTHESIA: General  ESTIMATED BLOOD LOSS: 803 mL    COMPLICATIONS:  None.   UNIQUE ASPECTS OF THE CASE: Her hip was extremely contracted, and it was difficult to prep.  The bone quality was poor.  I had to lateralize fairly aggressively in order to get to the side, initially I had a size 3, but was rotationally unstable and so I went with a 4.  The 4 however was just a little bit proud, and so I ended up using the -3 neck.  It was still challenging to reduce, the soft tissue repaired quite well, she felt neutral from a leg length standpoint, hopefully she will remain stable, and had an excellent posterior capsular repair.      COMPONENTS:  Chemical engineer Femoral Fracture stem size 4, with a -3 spacer and a size 45 fracture head unipolar hip ball.    PROCEDURE IN DETAIL: The patient was met in the holding area and identified.  The appropriate hip  was marked at the operative site. The patient was then transported to the OR and  placed under anesthesia.  At that point, the patient was  placed in the lateral decubitus position with the operative side up and  secured to the operating room table and all bony prominences padded.     The operative lower extremity was prepped from the iliac crest to the toes.  Sterile draping was performed.  Time out was performed prior to incision.      A routine posterolateral approach was utilized via sharp dissection  carried down to the subcutaneous tissue.  Gross bleeders were Bovie  coagulated.  The iliotibial band was identified and incised  along the length of the skin incision.  Self-retaining retractors were  inserted.  With the hip internally rotated, the short external rotators  were identified. The piriformis was tagged with FiberWire, and the hip capsule released in a T-type fashion.  The femoral neck was exposed, and I resected the femoral neck using the appropriate jig. This was performed at approximately a thumb's breadth above the lesser trochanter.    I then exposed the deep acetabulum, cleared out any tissue including the ligamentum teres, and included the hip capsule in the FiberWire used above and below the T.    I then prepared the proximal femur using the cookie-cutter,  the lateralizing reamer, and then sequentially broached.  A trial utilized, and I reduced the hip and it was found to have excellent stability with functional range of motion. The trial components were then removed.   I then place the real implant and I impacted the real head ball into place. The hip was then reduced and taken through functional range of motion and found to have excellent  stability. Leg lengths were restored.  I then used a 2 mm drill bits to pass the FiberWire suture from the capsule and piriformis through the greater trochanter, and secured this. Excellent posterior capsular repair was achieved. I also closed the T in the capsule.  I then irrigated the hip copiously again with pulse lavage, and repaired the fascia with Vicryl, followed by Vicryl for the subcutaneous tissue, Monocryl for the skin, Steri-Strips and sterile gauze. The wounds were injected. The patient was then awakened and returned to PACU in stable and satisfactory condition. There were no complications.  Marchia Bond, MD Orthopedic Surgeon [phone]   03/22/2021 12:43 PM

## 2021-03-22 NOTE — Discharge Instructions (Signed)
Diet: As you were doing prior to hospitalization   Shower:  May shower but keep the wounds dry, use an occlusive plastic wrap, NO SOAKING IN TUB.  If the bandage gets wet, change with a clean dry gauze.  If you have a splint on, leave the splint in place and keep the splint dry with a plastic bag.  Dressing:  You may change your dressing 3-5 days after surgery, unless you have a splint.  If you have a splint, then just leave the splint in place and we will change your bandages during your first follow-up appointment.    If you had hand or foot surgery, we will plan to remove your stitches in about 2 weeks in the office.  For all other surgeries, there are sticky tapes (steri-strips) on your wounds and all the stitches are absorbable.  Leave the steri-strips in place when changing your dressings, they will peel off with time, usually 2-3 weeks.  Activity:  Increase activity slowly as tolerated, but follow the weight bearing instructions below.  The rules on driving is that you can not be taking narcotics while you drive, and you must feel in control of the vehicle.    Weight Bearing:  weight bearing as tolerated on right leg  To prevent constipation: you may use a stool softener such as -  Colace (over the counter) 100 mg by mouth twice a day  Drink plenty of fluids (prune juice may be helpful) and high fiber foods Miralax (over the counter) for constipation as needed.    Itching:  If you experience itching with your medications, try taking only a single pain pill, or even half a pain pill at a time.  You may take up to 10 pain pills per day, and you can also use benadryl over the counter for itching or also to help with sleep.   Precautions:  If you experience chest pain or shortness of breath - call 911 immediately for transfer to the hospital emergency department!!  If you develop a fever greater that 101 F, purulent drainage from wound, increased redness or drainage from wound, or calf pain  -- Call the office at [phone]                                                Follow- Up Appointment:  Please call for an appointment to be seen in 2 weeks [place] - [phone]     

## 2021-03-22 NOTE — Progress Notes (Signed)
Pt taught and encouraged throughout the shift to keep her right leg straight in bed.  Pt cannot understand the posterior hip precautions.  2 pillows placed between legs to remind her to keep the right leg straight. MD aware.

## 2021-03-22 NOTE — Anesthesia Postprocedure Evaluation (Signed)
Anesthesia Post Note  Patient: [name]  Procedure(s) Performed: TOTAL HIP ARTHROPLASTY, POSTERIOR HEMI (Right: Hip)     Patient location during evaluation: PACU Anesthesia Type: General Level of consciousness: confused (at baseline) Pain management: pain level controlled Vital Signs Assessment: post-procedure vital signs reviewed and stable Respiratory status: spontaneous breathing, nonlabored ventilation, respiratory function stable and patient connected to nasal cannula oxygen Cardiovascular status: blood pressure returned to baseline and stable Postop Assessment: no apparent nausea or vomiting Anesthetic complications: no   No notable events documented.  Last Vitals:  Vitals:   03/22/21 1345 03/22/21 1400  BP: 114/62 116/63  Pulse: 82 80  Resp: 13 15  Temp:    SpO2: 92% 93%    Last Pain:  Vitals:   03/22/21 0448  TempSrc: Oral                 Beryle Lathe

## 2021-03-22 NOTE — Progress Notes (Signed)
Patient seen and examined.  Discussed with son again today.  She is currently curled up into a ball in bed.  Positive pain with movement of the hip.  She would not really follow commands, and says that she cannot move her toes, she will not move her foot for me.  I discussed her case and decision making again with the patient's son, the DURABLE POWER OF ATTORNEY, he says that she was able to walk, and then for a long time she was in a wheelchair not capable of walking, and then more recently she began walking again with significant assistance.  She always has had significant balance issues and falls, and basically ambulates with nursing assistance all the time.  I was trying to get a sense of whether or not she was truly capable of ambulating before the current fall, and actually how old fracture is.  It is possible that this is truly a subacute fracture, although it is difficult to tell.  The risks benefits and alternatives were discussed with the patient and her son, Barbara Cower, including but not limited to the risks of nonoperative treatment, versus surgical intervention including infection, bleeding, nerve injury, periprosthetic fracture, the need for revision surgery, dislocation, leg length discrepancy, blood clots, cardiopulmonary complications, morbidity, mortality, among others, and they were willing to proceed.    Eulas Post, MD

## 2021-03-22 NOTE — Transfer of Care (Signed)
Immediate Anesthesia Transfer of Care Note  Patient: Tamara Fisher  Procedure(s) Performed: TOTAL HIP ARTHROPLASTY, POSTERIOR HEMI (Right: Hip)  Patient Location: PACU  Anesthesia Type:General  Level of Consciousness: drowsy and patient cooperative  Airway & Oxygen Therapy: Patient Spontanous Breathing and Patient connected to face mask  Post-op Assessment: Report given to RN and Post -op Vital signs reviewed and stable  Post vital signs: Reviewed and stable  Last Vitals:  Vitals Value Taken Time  BP 119/68 03/22/21 1300  Temp    Pulse 83 03/22/21 1301  Resp 17 03/22/21 1301  SpO2 95 % 03/22/21 1301  Vitals shown include unvalidated device data.  Last Pain:  Vitals:   03/22/21 0448  TempSrc: Oral         Complications: No notable events documented.

## 2021-03-22 NOTE — Anesthesia Procedure Notes (Signed)
Procedure Name: Intubation Date/Time: 03/22/2021 10:52 AM Performed by: Vanessa [place], CRNA Pre-anesthesia Checklist: Patient identified, Emergency Drugs available, Suction available and Patient being monitored Patient Re-evaluated:Patient Re-evaluated prior to induction Oxygen Delivery Method: Circle system utilized Preoxygenation: Pre-oxygenation with 100% oxygen Induction Type: IV induction Ventilation: Mask ventilation without difficulty Laryngoscope Size: 2 and Miller Grade View: Grade I Tube type: Oral Tube size: 7.0 mm Number of attempts: 1 Airway Equipment and Method: Stylet Placement Confirmation: ETT inserted through vocal cords under direct vision, positive ETCO2 and breath sounds checked- equal and bilateral Secured at: 21 cm Tube secured with: Tape Dental Injury: Teeth and Oropharynx as per pre-operative assessment

## 2021-03-22 NOTE — Progress Notes (Signed)
PROGRESS NOTE    Tamara Fisher  IRW:[id number] DOB: June 23, 1941 DOA: 03/21/2021 PCP: Medicine, [facility]  Outpatient Specialists: none    Brief Narrative:   Tamara Fisher is a 80 y.o. female with medical history significant for advanced dementia, who presents with the above.   Witnessed mechanical fall at her facility earlier today. Reportedly usual state of health. Is somewhat ambulatory at baseline. Wound to right eye. Not complaining of pain. Does have baseline behavioral disturbance.   Assessment & Plan:   Principal Problem:   Hip fracture (HCC) Active Problems:   Hypothyroidism   Lupus (HCC)   Vascular dementia with behavior disturbance (HCC)   # Acute right femoral neck fracture S/p operative repair today. Pain appears controlled. - norco/morphine for pain control - pt/ot consults   # Subdural hematoma Small, seen on CT. EDP discussed w/ neurosurgery who advised monitoring neurologic status, no further intervention if no neurologic changes.   # Laceration R eyebrow, CT face neg for fracture, hemostatic - local wound care   # Hypothyroid - cont home synthroid   # Dementia with behavioral disturbance - cont home depakote, haldol, lorazepam, zoloft - delirium precautions - slp swallow eval, fluids @ 75 pending that   # Lupus Appears quiescent   # CHF? Saw [facility] cardiology in 2018, given provisional dx of chf based on x-ray findings. TTE and nuclear stress tests ordered, results not available, son says that w/u was unremarkable, no signs fluid overload on exam - monitor   DVT prophylaxis: lovenox Code Status: dnr Family Communication: son updated telephonically 6/20  Level of care: Med-Surg Status is: Inpatient  Remains inpatient appropriate because:Inpatient level of care appropriate due to severity of illness  Dispo: The patient is from: SNF              Anticipated d/c is to: SNF              Patient currently is not  medically stable to d/c.   Difficult to place patient No        Consultants:  Orthopedic surgery  Procedures: Total hip arthroplasty right  Antimicrobials:  Ancef perioperative    Subjective: Resting in bed after surgery. Awake, does not appear in pain. Mumbles in response to questions.  Objective: Vitals:   03/22/21 1345 03/22/21 1400 03/22/21 1425 03/22/21 1441  BP: 114/62 116/63 117/80   Pulse: 82 80 83   Resp: 13 15 14    Temp:   98 F (36.7 C)   TempSrc:   Axillary   SpO2: 92% 93% 91% 93%  Weight:      Height:        Intake/Output Summary (Last 24 hours) at 03/22/2021 1512 Last data filed at 03/22/2021 1229 Gross per 24 hour  Intake 1150 ml  Output 400 ml  Net 750 ml   Filed Weights   03/21/21 0843  Weight: 68 kg    Examination:  Constitutional: No acute distress Head: bruise right eye with laceration that is glued closed Eyes: Conjunctiva clear ENM: Moist mucous membranes.   Neck: Supple Respiratory: Clear to auscultation bilaterally, no wheezing/rales/rhonchi. Normal respiratory effort. No accessory muscle use. . Cardiovascular: Regular rate and rhythm. Soft systolic murmur Abdomen: Non-tender, non-distended. No masses. No rebound or guarding. Positive bowel sounds. Musculoskeletal: No joint deformity upper and lower extremities. Normal ROM, no contractures. Normal muscle tone. Skin: No rashes, lesions, or ulcers. Sacrum examined Extremities: No peripheral edema. Palpable peripheral pulses. Neurologic: awake, moving all 4 extremities  Psychiatric: unable to assess   Data Reviewed: I have personally reviewed following labs and imaging studies  CBC: Recent Labs  Lab 03/21/21 1018  WBC 10.1  NEUTROABS 8.8*  HGB 14.0  HCT 43.1  MCV 94.9  PLT 157   Basic Metabolic Panel: Recent Labs  Lab 03/21/21 1018  NA 139  K 4.3  CL 102  CO2 29  GLUCOSE 122*  BUN 20  CREATININE 0.48  CALCIUM 9.5   GFR: Estimated Creatinine Clearance: 53.4  mL/min (by C-G formula based on SCr of 0.48 mg/dL). Liver Function Tests: No results for input(s): AST, ALT, ALKPHOS, BILITOT, PROT, ALBUMIN in the last 168 hours. No results for input(s): LIPASE, AMYLASE in the last 168 hours. No results for input(s): AMMONIA in the last 168 hours. Coagulation Profile: No results for input(s): INR, PROTIME in the last 168 hours. Cardiac Enzymes: No results for input(s): CKTOTAL, CKMB, CKMBINDEX, TROPONINI in the last 168 hours. BNP (last 3 results) No results for input(s): PROBNP in the last 8760 hours. HbA1C: No results for input(s): HGBA1C in the last 72 hours. CBG: No results for input(s): GLUCAP in the last 168 hours. Lipid Profile: No results for input(s): CHOL, HDL, LDLCALC, TRIG, CHOLHDL, LDLDIRECT in the last 72 hours. Thyroid Function Tests: No results for input(s): TSH, T4TOTAL, FREET4, T3FREE, THYROIDAB in the last 72 hours. Anemia Panel: No results for input(s): VITAMINB12, FOLATE, FERRITIN, TIBC, IRON, RETICCTPCT in the last 72 hours. Urine analysis:    Component Value Date/Time   COLORURINE YELLOW 10/17/2018 0755   APPEARANCEUR HAZY (A) 10/17/2018 0755   LABSPEC 1.024 10/17/2018 0755   PHURINE 5.0 10/17/2018 0755   GLUCOSEU NEGATIVE 10/17/2018 0755   HGBUR NEGATIVE 10/17/2018 0755   BILIRUBINUR NEGATIVE 10/17/2018 0755   KETONESUR NEGATIVE 10/17/2018 0755   PROTEINUR NEGATIVE 10/17/2018 0755   NITRITE NEGATIVE 10/17/2018 0755   LEUKOCYTESUR LARGE (A) 10/17/2018 0755   Sepsis Labs: @LABRCNTIP (procalcitonin:4,lacticidven:4)  ) Recent Results (from the past 240 hour(s))  Resp Panel by RT-PCR (Flu A&B, Covid) Nasopharyngeal Swab     Status: None   Collection Time: 03/21/21 10:17 AM   Specimen: Nasopharyngeal Swab; Nasopharyngeal(NP) swabs in vial transport medium  Result Value Ref Range Status   SARS Coronavirus 2 by RT PCR NEGATIVE NEGATIVE Final    Comment: (NOTE) SARS-CoV-2 target nucleic acids are NOT DETECTED.  The  SARS-CoV-2 RNA is generally detectable in upper respiratory specimens during the acute phase of infection. The lowest concentration of SARS-CoV-2 viral copies this assay can detect is 138 copies/mL. A negative result does not preclude SARS-Cov-2 infection and should not be used as the sole basis for treatment or other patient management decisions. A negative result may occur with  improper specimen collection/handling, submission of specimen other than nasopharyngeal swab, presence of viral mutation(s) within the areas targeted by this assay, and inadequate number of viral copies(<138 copies/mL). A negative result must be combined with clinical observations, patient history, and epidemiological information. The expected result is Negative.  Fact Sheet for Patients:  BloggerCourse.comhttps://www.fda.gov/media/152166/download  Fact Sheet for Healthcare Providers:  SeriousBroker.ithttps://www.fda.gov/media/152162/download  This test is no t yet approved or cleared by the [place] FDA and  has been authorized for detection and/or diagnosis of SARS-CoV-2 by FDA under an Emergency Use Authorization (EUA). This EUA will remain  in effect (meaning this test can be used) for the duration of the COVID-19 declaration under Section 564(b)(1) of the Act, 21 U.S.C.section 360bbb-3(b)(1), unless the authorization is terminated  or revoked sooner.  Influenza A by PCR NEGATIVE NEGATIVE Final   Influenza B by PCR NEGATIVE NEGATIVE Final    Comment: (NOTE) The Xpert Xpress SARS-CoV-2/FLU/RSV plus assay is intended as an aid in the diagnosis of influenza from Nasopharyngeal swab specimens and should not be used as a sole basis for treatment. Nasal washings and aspirates are unacceptable for Xpert Xpress SARS-CoV-2/FLU/RSV testing.  Fact Sheet for Patients: BloggerCourse.com  Fact Sheet for Healthcare Providers: SeriousBroker.it  This test is not yet approved or  cleared by the [place] FDA and has been authorized for detection and/or diagnosis of SARS-CoV-2 by FDA under an Emergency Use Authorization (EUA). This EUA will remain in effect (meaning this test can be used) for the duration of the COVID-19 declaration under Section 564(b)(1) of the Act, 21 U.S.C. section 360bbb-3(b)(1), unless the authorization is terminated or revoked.  Performed at [facility], [address], [place], [place] [address]          Radiology Studies: DG Pelvis 1-2 Views  Result Date: 03/21/2021 CLINICAL DATA:  Acute RIGHT hip pain following fall. Initial encounter. EXAM: PELVIS - 1-2 VIEW COMPARISON:  None. FINDINGS: A RIGHT femoral neck fracture is noted with approximately 1.5 cm SUPERIOR displacement. There is no evidence of dislocation. No other fractures are identified. IMPRESSION: RIGHT femoral neck fracture with approximately 1.5 cm SUPERIOR displacement. Electronically Signed   By: Harmon Pier M.D.   On: 03/21/2021 10:10   CT Head Wo Contrast  Result Date: 03/21/2021 CLINICAL DATA:  80 year old female status post fall. Right forehead laceration. EXAM: CT HEAD WITHOUT CONTRAST TECHNIQUE: Contiguous axial images were obtained from the base of the skull through the vertex without intravenous contrast. COMPARISON:  Head CT 09/12/2018. Face and cervical spine CT today reported separately. FINDINGS: Brain: No midline shift, mass effect, or evidence of intracranial mass lesion. No ventriculomegaly. Confluent bilateral deep white matter hypodensity appears stable since 2019 compatible with chronic small vessel disease. Chronic thickening of the falx is noted, but at the level of the pericallosal sulcus thickness and density appear increased compared to 2019 suspicious for trace para falcine subdural blood (series 6, image 49). But no other intracranial hemorrhage is identified. No cortically based acute infarct identified. No suspicious intracranial  vascular hyperdensity. Vascular: Calcified atherosclerosis at the skull base. Skull: No fracture identified.  Stable hyperostosis frontalis. Sinuses/Orbits: Visualized paranasal sinuses and mastoids are stable and well aerated. Other: Mild right forehead scalp hematoma or contusion on series 4, image 29. No scalp soft tissue gas. Visualized orbit soft tissues are within normal limits. IMPRESSION: 1. Appearance suspicious for trace parafalcine subdural blood. 2. No intracranial mass effect or other acute traumatic injury to the brain identified. Chronic small vessel disease. 3. Mild right forehead scalp hematoma or contusion without underlying skull fracture. Electronically Signed   By: Odessa Fleming M.D.   On: 03/21/2021 10:59   CT Cervical Spine Wo Contrast  Result Date: 03/21/2021 CLINICAL DATA:  80 year old female status post fall. Right forehead laceration. EXAM: CT CERVICAL SPINE WITHOUT CONTRAST TECHNIQUE: Multidetector CT imaging of the cervical spine was performed without intravenous contrast. Multiplanar CT image reconstructions were also generated. COMPARISON:  Cervical spine CT 11/20/2018. FINDINGS: Alignment: Preserved cervical lordosis. Cervicothoracic junction alignment is within normal limits. Bilateral posterior element alignment is within normal limits. Skull base and vertebrae: Osteopenia. Visualized skull base is intact. No atlanto-occipital dissociation. C1 and C2 appear intact and normally aligned. No acute osseous abnormality identified. Soft tissues and spinal canal: No prevertebral  fluid or swelling. No visible canal hematoma. Chronic severe right carotid bifurcation calcified plaque. Bulky left side cervical carotid calcified plaque also. Disc levels: Mild for age cervical spine degeneration. Chronic disc and endplate degeneration maximal at C6-C7. Upper chest: Stable, negative. IMPRESSION: No acute traumatic injury identified in the cervical spine. Mild for age cervical spine degeneration.  Electronically Signed   By: Odessa Fleming M.D.   On: 03/21/2021 11:06   CT Maxillofacial WO CM  Result Date: 03/21/2021 CLINICAL DATA:  80 year old female status post fall. Right forehead laceration. EXAM: CT MAXILLOFACIAL WITHOUT CONTRAST TECHNIQUE: Multidetector CT imaging of the maxillofacial structures was performed. Multiplanar CT image reconstructions were also generated. COMPARISON:  Head CT today reported separately. FINDINGS: Osseous: Motion artifact through the mandible on the initial images, repeated on series 5. Mandible appears intact and normally located. No maxilla, zygoma, or pterygoid fracture. Central skull base appears intact with osteopenia. No nasal bone fracture identified. Orbits: No orbital wall fracture. Mild right lateral periorbital soft tissue hematoma or contusion on series eight image 20. Globes and intra orbital soft tissues remain normal. Sinuses: Clear, only trace sinus mucosal thickening. Tympanic cavities and mastoids are clear. Soft tissues: Bulky calcified carotid atherosclerosis in the neck especially on the right side. Otherwise negative visible noncontrast deep soft tissue spaces of the face and neck. Limited intracranial: Reported separately today. IMPRESSION: 1. Mild right lateral periorbital soft tissue hematoma or contusion. 2. No facial fracture or other acute traumatic injury identified. 3. Severe calcified atherosclerosis of the right carotid bifurcation. Electronically Signed   By: Odessa Fleming M.D.   On: 03/21/2021 11:03        Scheduled Meds:  acetaminophen  500 mg Oral Q6H   divalproex  250 mg Oral Q8H   docusate sodium  100 mg Oral BID   [START ON 03/23/2021] enoxaparin (LOVENOX) injection  40 mg Subcutaneous Q24H   [START ON 03/23/2021] ferrous sulfate  325 mg Oral TID PC   haloperidol  1 mg Oral TID   LORazepam  1 mg Intramuscular Once   LORazepam  0.5 mg Oral BID   senna  1 tablet Oral BID   sertraline  100 mg Oral Daily   Continuous Infusions:  0.45 %  NaCl with KCl 20 mEq / L      ceFAZolin (ANCEF) IV     tranexamic acid       LOS: 1 day    Time spent: 25 min    Silvano Bilis, MD [facility]   If 7PM-7AM, please contact night-coverage [email or web address] Password [facility] 03/22/2021, 3:12 PM

## 2021-03-22 NOTE — Progress Notes (Signed)
SLP Cancellation Note  Patient Details Name: Tamara Fisher MRN: [id number] DOB: [birth year]   Cancelled treatment:       Reason Eval/Treat Not Completed: Patient at procedure or test/unavailable. Plans for surgery today, pt NPO. Will f/u for swallow evaluation   Keyundra Fant, Riley Nearing 03/22/2021, 7:21 AM

## 2021-03-22 NOTE — Progress Notes (Signed)
Pt not following posterior hip precautions in bed. Pt not able to understand instructions due to her dementia.  Dr. Dion Saucier notified, RN asked him if he wants a wedge pillow ordered, he said no.

## 2021-03-23 ENCOUNTER — Encounter (HOSPITAL_COMMUNITY): Payer: Self-pay | Admitting: Orthopedic Surgery

## 2021-03-23 DIAGNOSIS — M329 Systemic lupus erythematosus, unspecified: Secondary | ICD-10-CM

## 2021-03-23 DIAGNOSIS — R627 Adult failure to thrive: Secondary | ICD-10-CM

## 2021-03-23 DIAGNOSIS — F0151 Vascular dementia with behavioral disturbance: Secondary | ICD-10-CM

## 2021-03-23 LAB — CBC
HCT: 31.8 % — ABNORMAL LOW (ref 36.0–46.0)
Hemoglobin: 10.4 g/dL — ABNORMAL LOW (ref 12.0–15.0)
MCH: 30.9 pg (ref 26.0–34.0)
MCHC: 32.7 g/dL (ref 30.0–36.0)
MCV: 94.4 fL (ref 80.0–100.0)
Platelets: 131 10*3/uL — ABNORMAL LOW (ref 150–400)
RBC: 3.37 MIL/uL — ABNORMAL LOW (ref 3.87–5.11)
RDW: 14 % (ref 11.5–15.5)
WBC: 11 10*3/uL — ABNORMAL HIGH (ref 4.0–10.5)
nRBC: 0 % (ref 0.0–0.2)

## 2021-03-23 LAB — BASIC METABOLIC PANEL
Anion gap: 6 (ref 5–15)
BUN: 20 mg/dL (ref 8–23)
CO2: 27 mmol/L (ref 22–32)
Calcium: 8.4 mg/dL — ABNORMAL LOW (ref 8.9–10.3)
Chloride: 101 mmol/L (ref 98–111)
Creatinine, Ser: 0.86 mg/dL (ref 0.44–1.00)
GFR, Estimated: >60 mL/min (ref >60)
Glucose, Bld: 119 mg/dL — ABNORMAL HIGH (ref 70–99)
Potassium: 4.4 mmol/L (ref 3.5–5.1)
Sodium: 134 mmol/L — ABNORMAL LOW (ref 135–145)

## 2021-03-23 MED ORDER — LACTATED RINGERS IV SOLN: INTRAVENOUS | Status: AC

## 2021-03-23 MED ORDER — SODIUM CHLORIDE 0.9 % IV BOLUS
500.0000 mL | Freq: Once | INTRAVENOUS | Status: AC
  Administered 2021-03-23: 500 mL via INTRAVENOUS

## 2021-03-23 NOTE — Evaluation (Signed)
Physical Therapy Evaluation Patient Details Name: Tamara Fisher MRN: [id number] DOB: Feb 17, 1941 Today's Date: 03/23/2021   History of Present Illness  80 yo female admitted6/20/22 after a fall at  [facility], sustained dispaced  right femoral neck fracture, S/P Posterior hemiarthroplasy on 03/22/21. PHX:[id number]  Clinical Impression  Patient  resting in bed, legs flexed and tightly adducted. 2 person total assistance to sit  up to EOB. Patient  did balance self in sitting.  2 attempts to stand with  2 persons, total assistance,. Patient unable to fully stand but did place some weight when partially standing.  Unsure of  prior function at The facility where she resides.Patient  does not follow any directions nor answer any questions except that she acknowledged that her name is Tamara Fisher. Pt admitted with above diagnosis.  Pt currently with functional limitations due to the deficits listed below (see PT Problem List). Pt will benefit from skilled PT to increase their independence and safety with mobility to allow discharge to the venue listed below.       Follow Up Recommendations SNF    Equipment Recommendations  None recommended by PT    Recommendations for Other Services       Precautions / Restrictions Precautions Precautions: Posterior Hip;Fall Restrictions RLE Weight Bearing: Weight bearing as tolerated      Mobility  Bed Mobility Overal bed mobility: Needs Assistance Bed Mobility: Supine to Sit;Sit to Supine     Supine to sit: +2 for safety/equipment;+2 for physical assistance;Total assist Sit to supine: Total assist   General bed mobility comments: assist with legs and trunk to sit up and return to supine    Transfers Overall transfer level: Needs assistance Equipment used: Rolling walker (2 wheeled);2 person hand held assist Transfers: Sit to/from Stand Sit to Stand: Total assist;+2 physical assistance;+2 safety/equipment         General transfer  comment: attempted standing at RW, pt. did not rise, stood again with arm holds and stood slightly higher, requiring max/total of 2 persons  Ambulation/Gait             General Gait Details: NT  Stairs            Wheelchair Mobility    Modified Rankin (Stroke Patients Only)       Balance Overall balance assessment: History of Falls;Needs assistance Sitting-balance support: Feet supported;Bilateral upper extremity supported Sitting balance-Leahy Scale: Poor Sitting balance - Comments: initially leaning, gradually gained static sitting  with min guard   Standing balance support: During functional activity;Bilateral upper extremity supported Standing balance-Leahy Scale: Zero Standing balance comment: unable to fully stand                             Pertinent Vitals/Pain Pain Assessment: Faces Faces Pain Scale: Hurts little more Pain Location: does not indicate location, appears right hip when attempting to place pillow between  legs    Home Living Family/patient expects to be discharged to:: Skilled nursing facility                      Prior Function           Comments: unsure     Hand Dominance        Extremity/Trunk Assessment        Lower Extremity Assessment Lower Extremity Assessment: RLE deficits/detail;LLE deficits/detail RLE Deficits / Details: knees tightly adducted together, difficult to wedge a pillow  between knees  due to posterior hip precautions. Did bear a little weight to partially stand LLE Deficits / Details: started to lift leg to cross knees when in sitting.    Cervical / Trunk Assessment Cervical / Trunk Assessment: Kyphotic  Communication   Communication: Expressive difficulties  Cognition Arousal/Alertness: Awake/alert Behavior During Therapy: WFL for tasks assessed/performed Overall Cognitive Status: Impaired/Different from baseline                                 General Comments:  does not answer any questions nor follow directions      General Comments      Exercises     Assessment/Plan    PT Assessment Patient needs continued PT services  PT Problem List Decreased strength;Decreased mobility;Decreased safety awareness;Decreased range of motion;Decreased knowledge of precautions;Decreased activity tolerance;Decreased cognition;Decreased balance;Decreased knowledge of use of DME;Pain       PT Treatment Interventions Functional mobility training;Therapeutic activities;Therapeutic exercise;Cognitive remediation;Patient/family education    PT Goals (Current goals can be found in the Care Plan section)  Acute Rehab PT Goals PT Goal Formulation: Patient unable to participate in goal setting Time For Goal Achievement: 04/06/21 Potential to Achieve Goals: Fair    Frequency Min 2X/week   Barriers to discharge        Co-evaluation               AM-PAC PT "6 Clicks" Mobility  Outcome Measure Help needed turning from your back to your side while in a flat bed without using bedrails?: Total Help needed moving from lying on your back to sitting on the side of a flat bed without using bedrails?: Total Help needed moving to and from a bed to a chair (including a wheelchair)?: Total Help needed standing up from a chair using your arms (e.g., wheelchair or bedside chair)?: Total Help needed to walk in hospital room?: Total Help needed climbing 3-5 steps with a railing? : Total 6 Click Score: 6    End of Session Equipment Utilized During Treatment: Gait belt Activity Tolerance: Patient tolerated treatment well Patient left: in bed;with call bell/phone within reach;with bed alarm set Nurse Communication: Mobility status PT Visit Diagnosis: Unsteadiness on feet (R26.81);Muscle weakness (generalized) (M62.81)    Time: 6812-7517 PT Time Calculation (min) (ACUTE ONLY): 14 min   Charges:   PT Evaluation $PT Eval Low Complexity: 1 Low          Blanchard Kelch PT Acute Rehabilitation Services Pager [phone] Office [phone]   [place], [place] 03/23/2021, 8:59 AM

## 2021-03-23 NOTE — Progress Notes (Signed)
PROGRESS NOTE    Tamara Fisher  YOV:[id number] DOB: 1940/12/08 DOA: 03/21/2021 PCP: Medicine, [facility]  Outpatient Specialists: none    Brief Narrative:   Tamara Fisher is a 80 y.o. female with medical history significant for advanced dementia, who presents with the above.   Witnessed mechanical fall at her facility earlier today. Reportedly usual state of health. Is somewhat ambulatory at baseline. Wound to right eye. Not complaining of pain. Does have baseline behavioral disturbance.   Assessment & Plan:   Principal Problem:   Hip fracture [facility]) Active Problems:   Hypothyroidism   Lupus (HCC)   Vascular dementia with behavior disturbance (HCC)   # Acute right femoral neck fracture S/p total hip arthroplasty on 6/21  -Postop pain control, weightbearing status, wound care , postop DVT prophylaxis per Ortho l -Skilled nursing facility placement, awaiting for a bed   # Subdural hematoma Small, seen on CT. EDP discussed w/ neurosurgery who advised monitoring neurologic status, no further intervention if no neurologic changes.   # Laceration R eyebrow, CT face neg for fracture, hemostatic - local wound care   # Hypothyroid - cont home synthroid   # Dementia with behavioral disturbance - cont home depakote, haldol, lorazepam, zoloft - delirium precautions -Speech recommended soft diet, thin liquid -Continue IV hydration for another 24 hours, she will appear to have poor oral intake and dehydrated currently   # Lupus Appears quiescent   # CHF? Saw [facility] in 2018, given provisional dx of chf based on x-ray findings. TTE and nuclear stress tests ordered, results not available, son says that w/u was unremarkable, no signs fluid overload on exam - monitor -She will appear dehydrated, on gentle hydration  FTT: will need snf placement and likely will benefit from palliative care following at snf   DVT prophylaxis: lovenox Code  Status: dnr Family Communication: None at bedside  Level of care: Med-Surg Status is: Inpatient  Remains inpatient appropriate because:Inpatient level of care appropriate due to severity of illness  Dispo: The patient is from: SNF              Anticipated d/c is to: SNF with palliative care              Patient currently medically stable to discharge to snf, awaiting for insurance approval for snf   Difficult to place patient No    Consultants:  Orthopedic surgery  Procedures: Total hip arthroplasty right  Antimicrobials:  Ancef perioperative    Subjective: PoD#1, awake, alert, demented, does not follow commands, does not appear in pain. indwelling foley in place  Objective: Vitals:   03/23/21 0148 03/23/21 0623 03/23/21 0626 03/23/21 1011  BP: 104/66 104/64  (!) 87/58  Pulse: (!) 103 (!) 104 99 98  Resp: 17 18    Temp: 98.1 F (36.7 C) 98.4 F (36.9 C)  99.5 F (37.5 C)  TempSrc: Oral   Oral  SpO2: 94%  90% 90%  Weight:      Height:        Intake/Output Summary (Last 24 hours) at 03/23/2021 1126 Last data filed at 03/23/2021 0200 Gross per 24 hour  Intake 1902.46 ml  Output 1500 ml  Net 402.46 ml   Filed Weights   03/21/21 0843  Weight: 68 kg    Examination:  Constitutional: No acute distress, demented, currently no agitation Head: bruise right eye with laceration that is glued closed Eyes: Conjunctiva clear ENM: Moist mucous membranes.   Neck: Supple Respiratory:  Clear to auscultation bilaterally, no wheezing/rales/rhonchi. Normal respiratory effort. No accessory muscle use. . Cardiovascular: Regular rate and rhythm. Soft systolic murmur Abdomen: Non-tender, non-distended. No masses. No rebound or guarding. Positive bowel sounds. Musculoskeletal: No joint deformity upper and lower extremities. Normal ROM, no contractures. Normal muscle tone. Skin: No rashes, lesions, or ulcers. Sacrum examined Extremities: right hip post op changes, neurovascular  intact distally  Neurologic: awake, demented, no agitation, does not follow commands, mumbles, does not seems to understand questions Psychiatric: unable to assess   Data Reviewed: I have personally reviewed following labs and imaging studies  CBC: Recent Labs  Lab 03/21/21 1018 03/23/21 0303  WBC 10.1 11.0*  NEUTROABS 8.8*  --   HGB 14.0 10.4*  HCT 43.1 31.8*  MCV 94.9 94.4  PLT 157 131*   Basic Metabolic Panel: Recent Labs  Lab 03/21/21 1018 03/23/21 0303  NA 139 134*  K 4.3 4.4  CL 102 101  CO2 29 27  GLUCOSE 122* 119*  BUN 20 20  CREATININE 0.48 0.86  CALCIUM 9.5 8.4*   GFR: Estimated Creatinine Clearance: 49.7 mL/min (by C-G formula based on SCr of 0.86 mg/dL). Liver Function Tests: No results for input(s): AST, ALT, ALKPHOS, BILITOT, PROT, ALBUMIN in the last 168 hours. No results for input(s): LIPASE, AMYLASE in the last 168 hours. No results for input(s): AMMONIA in the last 168 hours. Coagulation Profile: No results for input(s): INR, PROTIME in the last 168 hours. Cardiac Enzymes: No results for input(s): CKTOTAL, CKMB, CKMBINDEX, TROPONINI in the last 168 hours. BNP (last 3 results) No results for input(s): PROBNP in the last 8760 hours. HbA1C: No results for input(s): HGBA1C in the last 72 hours. CBG: No results for input(s): GLUCAP in the last 168 hours. Lipid Profile: No results for input(s): CHOL, HDL, LDLCALC, TRIG, CHOLHDL, LDLDIRECT in the last 72 hours. Thyroid Function Tests: No results for input(s): TSH, T4TOTAL, FREET4, T3FREE, THYROIDAB in the last 72 hours. Anemia Panel: No results for input(s): VITAMINB12, FOLATE, FERRITIN, TIBC, IRON, RETICCTPCT in the last 72 hours. Urine analysis:    Component Value Date/Time   COLORURINE YELLOW 10/17/2018 0755   APPEARANCEUR HAZY (A) 10/17/2018 0755   LABSPEC 1.024 10/17/2018 0755   PHURINE 5.0 10/17/2018 0755   GLUCOSEU NEGATIVE 10/17/2018 0755   HGBUR NEGATIVE 10/17/2018 0755   BILIRUBINUR  NEGATIVE 10/17/2018 0755   KETONESUR NEGATIVE 10/17/2018 0755   PROTEINUR NEGATIVE 10/17/2018 0755   NITRITE NEGATIVE 10/17/2018 0755   LEUKOCYTESUR LARGE (A) 10/17/2018 0755   Sepsis Labs: @LABRCNTIP (procalcitonin:4,lacticidven:4)  ) Recent Results (from the past 240 hour(s))  Resp Panel by RT-PCR (Flu A&B, Covid) Nasopharyngeal Swab     Status: None   Collection Time: 03/21/21 10:17 AM   Specimen: Nasopharyngeal Swab; Nasopharyngeal(NP) swabs in vial transport medium  Result Value Ref Range Status   SARS Coronavirus 2 by RT PCR NEGATIVE NEGATIVE Final    Comment: (NOTE) SARS-CoV-2 target nucleic acids are NOT DETECTED.  The SARS-CoV-2 RNA is generally detectable in upper respiratory specimens during the acute phase of infection. The lowest concentration of SARS-CoV-2 viral copies this assay can detect is 138 copies/mL. A negative result does not preclude SARS-Cov-2 infection and should not be used as the sole basis for treatment or other patient management decisions. A negative result may occur with  improper specimen collection/handling, submission of specimen other than nasopharyngeal swab, presence of viral mutation(s) within the areas targeted by this assay, and inadequate number of viral copies(<138 copies/mL). A negative result  must be combined with clinical observations, patient history, and epidemiological information. The expected result is Negative.  Fact Sheet for Patients:  BloggerCourse.com  Fact Sheet for Healthcare Providers:  SeriousBroker.it  This test is no t yet approved or cleared by the [place] FDA and  has been authorized for detection and/or diagnosis of SARS-CoV-2 by FDA under an Emergency Use Authorization (EUA). This EUA will remain  in effect (meaning this test can be used) for the duration of the COVID-19 declaration under Section 564(b)(1) of the Act, 21 U.S.C.section 360bbb-3(b)(1),  unless the authorization is terminated  or revoked sooner.       Influenza A by PCR NEGATIVE NEGATIVE Final   Influenza B by PCR NEGATIVE NEGATIVE Final    Comment: (NOTE) The Xpert Xpress SARS-CoV-2/FLU/RSV plus assay is intended as an aid in the diagnosis of influenza from Nasopharyngeal swab specimens and should not be used as a sole basis for treatment. Nasal washings and aspirates are unacceptable for Xpert Xpress SARS-CoV-2/FLU/RSV testing.  Fact Sheet for Patients: BloggerCourse.com  Fact Sheet for Healthcare Providers: SeriousBroker.it  This test is not yet approved or cleared by the [place] FDA and has been authorized for detection and/or diagnosis of SARS-CoV-2 by FDA under an Emergency Use Authorization (EUA). This EUA will remain in effect (meaning this test can be used) for the duration of the COVID-19 declaration under Section 564(b)(1) of the Act, 21 U.S.C. section 360bbb-3(b)(1), unless the authorization is terminated or revoked.  Performed at [facility], [address] [place], [place] [address]          Radiology Studies: DG Pelvis Portable  Result Date: 03/22/2021 CLINICAL DATA:  Hip replacement EXAM: PORTABLE PELVIS 1-2 VIEWS COMPARISON:  03/21/2021 FINDINGS: Interval right hip hemiarthroplasty with intact hardware and normal alignment. Gas in the soft tissues consistent with recent surgery. IMPRESSION: Interval right hip replacement with expected postsurgical changes Electronically Signed   By: Jasmine Pang M.D.   On: 03/22/2021 17:48   DG HIP PORT UNILAT WITH PELVIS 1V RIGHT  Result Date: 03/22/2021 CLINICAL DATA:  Hip replacement EXAM: DG HIP (WITH OR WITHOUT PELVIS) 1V PORT RIGHT COMPARISON:  03/21/2021 FINDINGS: Pubic symphysis and rami are intact. Interval right hip hemiarthroplasty with intact hardware and normal alignment. Gas in the soft tissues consistent with recent  surgery IMPRESSION: Right hip replacement with expected postsurgical change Electronically Signed   By: Jasmine Pang M.D.   On: 03/22/2021 17:47        Scheduled Meds:  acetaminophen  500 mg Oral Q6H   divalproex  250 mg Oral Q8H   docusate sodium  100 mg Oral BID   enoxaparin (LOVENOX) injection  40 mg Subcutaneous Q24H   ferrous sulfate  325 mg Oral TID PC   haloperidol  1 mg Oral TID   LORazepam  1 mg Intramuscular Once   LORazepam  0.5 mg Oral BID   senna  1 tablet Oral BID   sertraline  100 mg Oral Daily   Continuous Infusions:  sodium chloride       LOS: 2 days    Time spent: 25 min    Albertine Grates, MD PhD FACP [facility]   If 7PM-7AM, please contact night-coverage [email or web address] Password [facility] 03/23/2021, 11:26 AM

## 2021-03-23 NOTE — Progress Notes (Signed)
Subjective: 1 Day Post-Op s/p Procedure(s): TOTAL HIP ARTHROPLASTY, POSTERIOR HEMI   Patient pleasantly confused. Not oriented to person, place or time. Denies hip pain. Denies chest pain, SOB, Calf pain. No nausea/vomiting.   Objective:  PE: VITALS:   Vitals:   03/23/21 0148 03/23/21 0623 03/23/21 0626 03/23/21 1011  BP: 104/66 104/64  (!) 87/58  Pulse: (!) 103 (!) 104 99 98  Resp: 17 18    Temp: 98.1 F (36.7 C) 98.4 F (36.9 C)  99.5 F (37.5 C)  TempSrc: Oral   Oral  SpO2: 94%  90% 90%  Weight:      Height:       Sitting up in bed. Knees bent at 40 degrees bilaterally. Dorsiflexion and plantarflexion intact. Able to flex and extend all toes. Unable to endorse sensation. 2+ DP pulse. Dressing CDI.   LABS  Results for orders placed or performed during the hospital encounter of 03/21/21 (from the past 24 hour(s))  CBC     Status: Abnormal   Collection Time: 03/23/21  3:03 AM  Result Value Ref Range   WBC 11.0 (H) 4.0 - 10.5 K/uL   RBC 3.37 (L) 3.87 - 5.11 MIL/uL   Hemoglobin 10.4 (L) 12.0 - 15.0 g/dL   HCT 16.1 (L) 09.6 - 04.5 %   MCV 94.4 80.0 - 100.0 fL   MCH 30.9 26.0 - 34.0 pg   MCHC 32.7 30.0 - 36.0 g/dL   RDW 40.9 81.1 - 91.4 %   Platelets 131 (L) 150 - 400 K/uL   nRBC 0.0 0.0 - 0.2 %  Basic metabolic panel     Status: Abnormal   Collection Time: 03/23/21  3:03 AM  Result Value Ref Range   Sodium 134 (L) 135 - 145 mmol/L   Potassium 4.4 3.5 - 5.1 mmol/L   Chloride 101 98 - 111 mmol/L   CO2 27 22 - 32 mmol/L   Glucose, Bld 119 (H) 70 - 99 mg/dL   BUN 20 8 - 23 mg/dL   Creatinine, Ser 7.82 0.44 - 1.00 mg/dL   Calcium 8.4 (L) 8.9 - 10.3 mg/dL   GFR, Estimated >95 >62 mL/min   Anion gap 6 5 - 15    DG Pelvis Portable  Result Date: 03/22/2021 CLINICAL DATA:  Hip replacement EXAM: PORTABLE PELVIS 1-2 VIEWS COMPARISON:  03/21/2021 FINDINGS: Interval right hip hemiarthroplasty with intact hardware and normal alignment. Gas in the soft tissues  consistent with recent surgery. IMPRESSION: Interval right hip replacement with expected postsurgical changes Electronically Signed   By: Jasmine Pang M.D.   On: 03/22/2021 17:48   DG HIP PORT UNILAT WITH PELVIS 1V RIGHT  Result Date: 03/22/2021 CLINICAL DATA:  Hip replacement EXAM: DG HIP (WITH OR WITHOUT PELVIS) 1V PORT RIGHT COMPARISON:  03/21/2021 FINDINGS: Pubic symphysis and rami are intact. Interval right hip hemiarthroplasty with intact hardware and normal alignment. Gas in the soft tissues consistent with recent surgery IMPRESSION: Right hip replacement with expected postsurgical change Electronically Signed   By: Jasmine Pang M.D.   On: 03/22/2021 17:47    Assessment/Plan: Right femoral neck fracture 1 Day Post-Op s/p Procedure(s): TOTAL HIP ARTHROPLASTY, POSTERIOR HEMI  Weightbearing: WBAT RLE Insicional and dressing care: Reinforce dressings as needed VTE prophylaxis: lovenox x 30 days Pain control: tylenol prn, tramadol for severe pain Follow - up plan: 2 weeks with Dr. Erskine Emery information:   ZHYQMVHQ 8-5 Janine Ores, PA-C [phone] A fter hours and holidays please  check [email or web address] for group call information for [facility]  Armida Sans 03/23/2021, 12:45 PM

## 2021-03-23 NOTE — TOC Initial Note (Signed)
Transition of Care [facility]) - Initial/Assessment Note    Patient Details  Name: Tamara Fisher MRN: [id number] Date of Birth: 12-05-1940  Transition of Care [facility]) CM/SW Contact:    Amada Jupiter, LCSW Phone Number: 03/23/2021, 2:23 PM  Clinical Narrative:                 Pt with significant dementia and unable to participate in interview.  Spoke with pt's son, Barbara Cower, who confirms pt has been a resident at [facility] in the memory care unit.  He is aware that therapies are recommending SNF and is in agreement. We discussed limited number of facilities in network with insurance and he is aware.  Will begin bed search.  Expected Discharge Plan: Skilled Nursing Facility Barriers to Discharge: Continued Medical Work up, English as a second language teacher   Patient Goals and CMS Choice Patient states their goals for this hospitalization and ongoing recovery are:: unable to verbalize   Choice offered to / list presented to : Adult Children  Expected Discharge Plan and Services Expected Discharge Plan: Skilled Nursing Facility In-house Referral: Clinical Social Work   Post Acute Care Choice: Skilled Nursing Facility Living arrangements for the past 2 months: Assisted Press photographer (memory care at [facility])                 DME Arranged: N/A DME Agency: NA                  Prior Living Arrangements/Services Living arrangements for the past 2 months: Assisted Press photographer (memory care at [facility]) Lives with:: Facility Resident Patient language and need for interpreter reviewed:: Yes        Need for Family Participation in Patient Care: No (Comment) Care giver support system in place?: Yes (comment)   Criminal Activity/Legal Involvement Pertinent to Current Situation/Hospitalization: No - Comment as needed  Activities of Daily Living Home Assistive Devices/Equipment: Environmental consultant (specify type), Grab bars around toilet, Grab bars in shower, Hand-held shower hose ADL  Screening (condition at time of admission) Patient's cognitive ability adequate to safely complete daily activities?: No Is the patient deaf or have difficulty hearing?: No Does the patient have difficulty seeing, even when wearing glasses/contacts?: No Does the patient have difficulty concentrating, remembering, or making decisions?: Yes Patient able to express need for assistance with ADLs?: No Does the patient have difficulty dressing or bathing?: Yes Independently performs ADLs?: No Communication: Independent Dressing (OT): Needs assistance Is this a change from baseline?: Pre-admission baseline Grooming: Needs assistance Is this a change from baseline?: Pre-admission baseline Feeding: Needs assistance Is this a change from baseline?: Pre-admission baseline Bathing: Needs assistance Is this a change from baseline?: Pre-admission baseline Toileting: Dependent Is this a change from baseline?: Change from baseline, expected to last >3days In/Out Bed: Dependent Is this a change from baseline?: Change from baseline, expected to last >3 days Walks in Home: Dependent Is this a change from baseline?: Change from baseline, expected to last >3 days Does the patient have difficulty walking or climbing stairs?: Yes (secondary to fracturef femur and weakness) Weakness of Legs: Right Weakness of Arms/Hands: None  Permission Sought/Granted Permission sought to share information with : Family Supports    Share Information with NAME: Cecylia Brazill     Permission granted to share info w Relationship: son  Permission granted to share info w Contact Information: [phone]  Emotional Assessment Appearance:: Appears older than stated age Attitude/Demeanor/Rapport: Unresponsive, Unable to Assess Affect (typically observed): Unable to Assess Orientation: :  Oriented to Self Alcohol / Substance Use: Not Applicable Psych Involvement: No (comment)  Admission diagnosis:  Hip fracture (HCC)  [S72.009A] Acute intra-cranial hemorrhage (HCC) [I62.9] Facial laceration, initial encounter [S01.81XA] Fall, initial encounter [W19.XXXA] Closed fracture of right hip, initial encounter [facility]) [S72.001A] Patient Active Problem List   Diagnosis Date Noted   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   Hip fracture (HCC) 03/21/2021   Lupus (HCC) 07/31/2018   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

## 2021-03-23 NOTE — Plan of Care (Signed)
  Problem: Nutrition: Goal: Adequate nutrition will be maintained Outcome: Progressing   Problem: Coping: Goal: Level of anxiety will decrease Outcome: Progressing   Problem: Pain Managment: Goal: General experience of comfort will improve Outcome: Progressing   Problem: Safety: Goal: Ability to remain free from injury will improve Outcome: Progressing   

## 2021-03-23 NOTE — NC FL2 (Signed)
[place] MEDICAID FL2 LEVEL OF CARE SCREENING TOOL     IDENTIFICATION  Patient Name: Tamara Fisher Birthdate: Jan 01, 1941 Sex: female Admission Date (Current Location): 03/21/2021  [facility] and [place] Number:  Producer, television/film/video and Address:  [facility],  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   Vascular dementia with behavior disturbance (HCC) 07/31/2018   Fall 08/22/2017   Hypothyroidism 08/22/2017   Carotid artery disease (HCC) 08/22/2017   CHF (congestive heart failure) (HCC) 04/05/2017   Acquired hypothyroidism 05/20/2016    Orientation RESPIRATION BLADDER Height & Weight     Self  Normal Incontinent, Indwelling catheter Weight: 150 lb (68 kg) Height:  5\' 6"  (167.6 cm)  BEHAVIORAL SYMPTOMS/MOOD NEUROLOGICAL BOWEL NUTRITION STATUS      Incontinent Diet (mechanical soft/ Dys 3)  AMBULATORY STATUS COMMUNICATION OF NEEDS Skin   Extensive Assist Does not communicate Surgical wounds                       Personal Care Assistance Level of Assistance  Bathing, Feeding, Dressing, Total care Bathing Assistance: Maximum assistance Feeding assistance: Limited assistance Dressing Assistance: Limited assistance Total Care Assistance: Maximum assistance   Functional Limitations Info  Hearing   Hearing Info: Impaired       SPECIAL CARE FACTORS FREQUENCY  OT (By licensed OT), PT (By licensed PT)     PT Frequency: 5x/wk OT Frequency: 5x/wk            Contractures      Additional Factors Info  Code Status, Allergies, Psychotropic Code Status Info: DNR Allergies Info: see MAR Psychotropic Info: see MAR         Current Medications (03/23/2021):  This is the current hospital active medication list Current Facility-Administered Medications  Medication Dose Route Frequency Provider Last Rate Last Admin   acetaminophen (TYLENOL) tablet 325-650 mg  325-650 mg Oral Q6H PRN 03/25/2021 K, PA-C       alum & mag hydroxide-simeth (MAALOX/MYLANTA) 200-200-20 MG/5ML suspension 30 mL  30 mL Oral Q4H PRN 04-10-2001 K, PA-C       bisacodyl (DULCOLAX) suppository 10 mg  10 mg Rectal Daily PRN Janine Ores K, PA-C       divalproex (DEPAKOTE) DR tablet 250 mg  250 mg Oral Q8H Brown, Blaine K, PA-C   250 mg at 03/23/21 1414   docusate sodium (COLACE) capsule 100 mg  100 mg Oral BID 03/25/21 K, PA-C   100 mg at 03/23/21 0918   enoxaparin (LOVENOX) injection 40 mg  40 mg Subcutaneous Q24H 03/25/21 K, PA-C   40 mg at 03/23/21 0919   ferrous sulfate tablet 325 mg  325 mg Oral TID PC Brown, Blaine K, PA-C   325 mg at 03/23/21 1242   haloperidol (HALDOL) tablet 1 mg  1  mg Oral TID Janine Ores K, PA-C   1 mg at 03/23/21 4967   LORazepam (ATIVAN) injection 1 mg  1 mg Intramuscular Once Brown, Blaine K, PA-C       LORazepam (ATIVAN) tablet 0.5 mg  0.5 mg Oral BID Armida Sans, PA-C   0.5 mg at 03/23/21 5916   magnesium citrate solution 1 Bottle  1 Bottle Oral Once PRN Armida Sans, PA-C       menthol-cetylpyridinium (CEPACOL) lozenge 3 mg  1 lozenge Oral PRN Janine Ores K, PA-C       Or   phenol (CHLORASEPTIC) mouth spray 1 spray  1 spray Mouth/Throat PRN Janine Ores K, PA-C       ondansetron [facility]) tablet 4 mg  4 mg Oral Q6H PRN Janine Ores K, PA-C       Or   ondansetron [facility]) injection  4 mg  4 mg Intravenous Q6H PRN Janine Ores K, PA-C       polyethylene glycol (MIRALAX / GLYCOLAX) packet 17 g  17 g Oral Daily PRN Manson Passey, Blaine K, PA-C       senna (SENOKOT) tablet 8.6 mg  1 tablet Oral BID Janine Ores K, PA-C   8.6 mg at 03/23/21 3846   sertraline (ZOLOFT) tablet 100 mg  100 mg Oral Daily Janine Ores K, PA-C   100 mg at 03/23/21 6599   traMADol (ULTRAM) tablet 50 mg  50 mg Oral Q6H PRN Janine Ores K, PA-C   50 mg at 03/23/21 1414     Discharge Medications: Please see discharge summary for a list of discharge medications.  Relevant Imaging Results:  Relevant Lab Results:   Additional Information SSN  [id number];  pt has had COVID vaccines and boosters  Dell Hurtubise, LCSW

## 2021-03-23 NOTE — Progress Notes (Signed)
Occupational Therapy Evaluation  Patient with flat affect, pleasant however minimally converses with therapists and does not initiate directions with multimodal cues. Unable to glean history from patient, per chart review from a memory care unit 2* advanced dementia. Currently patient needing total assistance x2 for bed mobility and max x2 to attempt sit to stands from edge of bed. First with rolling walker, second with bilateral hand held. Patient minimally able to support weight in lower extremities, kyphotic posture unable to fully stand and with second attempt states "ow." Asked patient to point where however does not, assume pain in R hip. Will trial acute OT services to maximize patient overall endurance and strength to minimize caregiver burden with self care tasks, patient will need 24/7 care post acute D/C.     03/23/21 0900  OT Visit Information  Last OT Received On 03/23/21  Assistance Needed +2  PT/OT/SLP Co-Evaluation/Treatment Yes  Reason for Co-Treatment For patient/therapist safety;To address functional/ADL transfers  PT goals addressed during session Mobility/safety with mobility  OT goals addressed during session ADL's and self-care  History of Present Illness 80 yo female admitted6/20/22 after a fall at  [facility], sustained dispaced  right femoral neck fracture, S/P Posterior hemiarthroplasy on 03/22/21. [id number]  Precautions  Precautions Posterior Hip;Fall  Precaution Booklet Issued No  Precaution Comments patient with advanced dementia, not following precautions at bed level  Restrictions  Weight Bearing Restrictions Yes  RLE Weight Bearing WBAT  Home Living  Family/patient expects to be discharged to: Skilled nursing facility  Prior Function  Comments per chart review patient is from [facility] memory care, unsure how much assistance she needed for ambulation or self care  Communication  Communication Expressive difficulties;Other (comment) (very soft  spoken, verbalizes minimally)  Pain Assessment  Pain Assessment Faces  Faces Pain Scale 4  Pain Location does not indicate location, appears right hip when attempting to place pillow between legs  Pain Descriptors / Indicators Grimacing  Pain Intervention(s) Monitored during session  Cognition  Arousal/Alertness Awake/alert  Behavior During Therapy Flat affect  Overall Cognitive Status History of cognitive impairments - at baseline  General Comments history of advanced dementia; does not inititate mobility with multimodal cues  Upper Extremity Assessment  Upper Extremity Assessment Generalized weakness;Difficult to assess due to impaired cognition  Lower Extremity Assessment  Lower Extremity Assessment Defer to PT evaluation  Cervical / Trunk Assessment  Cervical / Trunk Assessment Kyphotic  ADL  Overall ADL's  Needs assistance/impaired  Eating/Feeding Maximal assistance;Sitting  Eating/Feeding Details (indicate cue type and reason) patient reliant on arms in sitting, OT hold cup while patient drink from straw  Grooming Maximal assistance;Sitting;Bed level  Upper Body Bathing Maximal assistance;Sitting;Bed level  Lower Body Bathing Total assistance;Sitting/lateral leans;Bed level  Upper Body Dressing  Maximal assistance;Sitting;Bed level  Lower Body Dressing Total assistance;Sitting/lateral leans;Bed level  Lower Body Dressing Details (indicate cue type and reason) unsure of level of assist prior however patient now has posterior hip precautions  Toilet Transfer Maximal assistance;+2 for physical assistance;+2 for safety/equipment;Cueing for sequencing;Cueing for safety;RW  Toilet Transfer Details (indicate cue type and reason) stood twice from edge of bed, max A x2 to power up to standing and difficulty fully extending legs very kyphotic posture. during second attempt patient stating "ow" but cannot elaborate where presume hip  Toileting- Clothing Manipulation and Hygiene Total  assistance;Bed level  Functional mobility during ADLs Maximal assistance;+2 for physical assistance;+2 for safety/equipment  General ADL Comments unsure of how much assistance patient needing at  baseline with self care however now post surgery with hip precautions and pain patient needing significant assistance for all self care tasks.  Bed Mobility  Overal bed mobility Needs Assistance  Bed Mobility Supine to Sit;Sit to Supine  Supine to sit +2 for safety/equipment;+2 for physical assistance;Total assist  Sit to supine Total assist  General bed mobility comments assist with legs and trunk to sit up and return to supine  Transfers  Overall transfer level Needs assistance  Equipment used Rolling walker (2 wheeled);2 person hand held assist  Transfers Sit to/from Stand  Sit to Stand Max assist;+2 physical assistance;+2 safety/equipment  General transfer comment please see toilet transfer in ADL section; minimally supporting weight through LEs and stating "ow" with second sit to stand  Balance  Overall balance assessment History of Falls;Needs assistance  Sitting-balance support Feet supported;Bilateral upper extremity supported  Sitting balance-Leahy Scale Poor  Sitting balance - Comments initial posterior lean  Postural control Posterior lean  Standing balance support During functional activity;Bilateral upper extremity supported  Standing balance-Leahy Scale Zero  Standing balance comment unable to fully stand  OT - End of Session  Equipment Utilized During Treatment Gait belt;Rolling walker  Activity Tolerance Patient limited by pain;Other (comment) (Patient limited by dementia)  Patient left in bed;with call bell/phone within reach;with bed alarm set  Nurse Communication Mobility status  OT Assessment  OT Recommendation/Assessment Patient needs continued OT Services  OT Visit Diagnosis History of falling (Z91.81);Pain  Pain - Right/Left Right  Pain - part of body Hip  OT Problem  List Pain;Decreased activity tolerance;Impaired balance (sitting and/or standing);Decreased knowledge of precautions;Decreased safety awareness  OT Plan  OT Frequency (ACUTE ONLY) Min 2X/week  OT Treatment/Interventions (ACUTE ONLY) Self-care/ADL training;Therapeutic activities;Patient/family education;Balance training  AM-PAC OT "6 Clicks" Daily Activity Outcome Measure (Version 2)  Help from another person eating meals? 2  Help from another person taking care of personal grooming? 2  Help from another person toileting, which includes using toliet, bedpan, or urinal? 1  Help from another person bathing (including washing, rinsing, drying)? 2  Help from another person to put on and taking off regular upper body clothing? 2  Help from another person to put on and taking off regular lower body clothing? 1  6 Click Score 10  OT Recommendation  Follow Up Recommendations SNF;Supervision/Assistance - 24 hour  OT Equipment None recommended by OT  Individuals Consulted  Consulted and Agree with Results and Recommendations Patient unable/family or caregiver not available  Acute Rehab OT Goals  Patient Stated Goal patient unable  OT Goal Formulation Patient unable to participate in goal setting  Time For Goal Achievement 04/06/21  Potential to Achieve Goals Fair  OT Time Calculation  OT Start Time (ACUTE ONLY) 0742  OT Stop Time (ACUTE ONLY) 0755  OT Time Calculation (min) 13 min  OT General Charges  $OT Visit 1 Visit  OT Evaluation  $OT Eval Low Complexity 1 Low  Written Expression  Dominant Hand  (cannot specify)   Marlyce Huge OT OT pager: [phone]

## 2021-03-23 NOTE — Evaluation (Addendum)
Clinical/Bedside Swallow Evaluation Patient Details  Name: Tamara Fisher MRN: [id number] Date of Birth: [birth year]  Today's Date: 03/23/2021 Time: SLP Start Time (ACUTE ONLY): 1151 SLP Stop Time (ACUTE ONLY): 1220 SLP Time Calculation (min) (ACUTE ONLY): 29 min  Past Medical History:  Past Medical History:  Diagnosis Date   Aneurysm of left carotid artery (HCC)    Dementia (HCC)    Hypothyroidism    Lupus (HCC)    Past Surgical History: History reviewed. No pertinent surgical history. HPI:  80 yo female with h/o dementia and behavioral disturbance adm to [facility] after fall on 6/20 - - pt underwent surgery for right hip fx on 6/21.  Swallow eval ordered.   Assessment / Plan / Recommendation Clinical Impression  Pt presents with cognitive based dysphagia which potentially is exacerbated by current medical illness/fall/surgery.  Pt with catheter tubing in her hand upon SLP entrance to room - thus SlP removed and provided her with blanket. Turned on music and provided her with washcloths for distraction prior to leaving room.  No focal CN deficits from clinical observation but her voice is hoarse with decreased strength. Pt willing to consume po of cereal bar, ice cream, and gingerale via straw.  Appearance of mild delay in oral manipulation/suspect oral holding across consistencies.  Delayed swallow most notably present with thin liquids - followed by throat clearing post-swallow.   Throat clearing may be indicative of laryngeal penetration.  Recommend strict aspiration precautions given pt's mentation and decreased mobility.    With attempts for pt to feed herself, she requires total assist to open her thumb to index finger on her right hand to hold food/cups, etc.  Pt missing some anterior dentition - unable to fully view - and given this coupled with mentation, recommend dys3/thin diet.    Will follow up x1 to assure tolerance given her surgery and subtle indications of dysphagia/potential  airway penetration.   SLP Visit Diagnosis: Dysphagia, unspecified (R13.10)    Aspiration Risk  Mild aspiration risk    Diet Recommendation Dysphagia 3 (Mech soft);Thin liquid   Liquid Administration via: Cup;Straw Medication Administration: Whole meds with puree Supervision: Staff to assist with self feeding Compensations: Slow rate;Small sips/bites Postural Changes: Remain upright for at least 30 minutes after po intake;Seated upright at 90 degrees    Other  Recommendations Oral Care Recommendations: Oral care QID   Follow up Recommendations Skilled Nursing facility      Frequency and Duration min 1 x/week  1 week       Prognosis Prognosis for Safe Diet Advancement: Fair Barriers to Reach Goals: Cognitive deficits      Swallow Study   General Date of Onset: 03/23/21 HPI: 80 yo female with h/o dementia and behavioral disturbance adm to [facility] after fall on 6/20 - - pt underwent surgery for right hip fx on 6/21.  Swallow eval ordered. Type of Study: Bedside Swallow Evaluation Diet Prior to this Study: Regular;Thin liquids Temperature Spikes Noted: No Respiratory Status: Room air Behavior/Cognition: Alert;Cooperative;Doesn't follow directions Oral Cavity Assessment: Other (comment) (WFL from what pt would allow SLP to observe) Oral Care Completed by SLP: No Oral Cavity - Dentition: Other (Comment) (appears with some teeth missing - did not open mouth adequately to assess) Vision: Functional for self-feeding Self-Feeding Abilities: Needs assist Patient Positioning: Upright in bed Baseline Vocal Quality: Hoarse;Low vocal intensity Volitional Cough: Cognitively unable to elicit Volitional Swallow: Unable to elicit    Oral/Motor/Sensory Function Overall Oral Motor/Sensory Function:  (no focal CN deficits  from clinical observation, pt did not follow commands)   Ice Chips Ice chips: Not tested   Thin Liquid Thin Liquid: Impaired Presentation: Straw;Self Fed Oral Phase  Functional Implications: Oral holding (suspect oral holding) Pharyngeal  Phase Impairments: Suspected delayed Swallow;Throat Clearing - Delayed;Throat Clearing - Immediate    Nectar Thick Nectar Thick Liquid: Not tested   Honey Thick Honey Thick Liquid: Not tested   Puree Puree: Impaired Presentation: Spoon Pharyngeal Phase Impairments: Throat Clearing - Delayed   Solid     Solid: Impaired Oral Phase Impairments: Impaired mastication Oral Phase Functional Implications: Prolonged oral transit Other Comments: pt with poor dentition and does not follow directions to allow SLP to observe for oral clearance      Chales Abrahams 03/23/2021,12:39 PM Rolena Infante, MS [facility] SLP Acute Rehab Services Office [phone] Pager [phone]

## 2021-03-24 ENCOUNTER — Other Ambulatory Visit: Payer: Self-pay

## 2021-03-24 DIAGNOSIS — F0150 Vascular dementia without behavioral disturbance: Secondary | ICD-10-CM

## 2021-03-24 LAB — CBC WITH DIFFERENTIAL/PLATELET
Abs Immature Granulocytes: 0.05 10*3/uL (ref 0.00–0.07)
Basophils Absolute: 0 10*3/uL (ref 0.0–0.1)
Basophils Relative: 0 %
Eosinophils Absolute: 0.1 10*3/uL (ref 0.0–0.5)
Eosinophils Relative: 1 %
HCT: 33.3 % — ABNORMAL LOW (ref 36.0–46.0)
Hemoglobin: 10.1 g/dL — ABNORMAL LOW (ref 12.0–15.0)
Immature Granulocytes: 1 %
Lymphocytes Relative: 13 %
Lymphs Abs: 1.1 10*3/uL (ref 0.7–4.0)
MCH: 30.4 pg (ref 26.0–34.0)
MCHC: 30.3 g/dL (ref 30.0–36.0)
MCV: 100.3 fL — ABNORMAL HIGH (ref 80.0–100.0)
Monocytes Absolute: 1 10*3/uL (ref 0.1–1.0)
Monocytes Relative: 12 %
Neutro Abs: 6.2 10*3/uL (ref 1.7–7.7)
Neutrophils Relative %: 73 %
Platelets: 107 10*3/uL — ABNORMAL LOW (ref 150–400)
RBC: 3.32 MIL/uL — ABNORMAL LOW (ref 3.87–5.11)
RDW: 13.9 % (ref 11.5–15.5)
WBC: 8.4 10*3/uL (ref 4.0–10.5)
nRBC: 0 % (ref 0.0–0.2)

## 2021-03-24 LAB — BASIC METABOLIC PANEL
Anion gap: 8 (ref 5–15)
BUN: 13 mg/dL (ref 8–23)
CO2: 26 mmol/L (ref 22–32)
Calcium: 8.3 mg/dL — ABNORMAL LOW (ref 8.9–10.3)
Chloride: 101 mmol/L (ref 98–111)
Creatinine, Ser: 0.54 mg/dL (ref 0.44–1.00)
GFR, Estimated: >60 mL/min (ref >60)
Glucose, Bld: 96 mg/dL (ref 70–99)
Potassium: 4.3 mmol/L (ref 3.5–5.1)
Sodium: 135 mmol/L (ref 135–145)

## 2021-03-24 LAB — MAGNESIUM: Magnesium: 1.9 mg/dL (ref 1.7–2.4)

## 2021-03-24 MED ORDER — TAMSULOSIN HCL 0.4 MG PO CAPS
0.4000 mg | ORAL_CAPSULE | Freq: Every day | ORAL | Status: DC
  Administered 2021-03-25 – 2021-04-01 (×8): 0.4 mg via ORAL
  Filled 2021-03-24 (×8): qty 1

## 2021-03-24 MED ORDER — FERROUS SULFATE 325 (65 FE) MG PO TABS
325.0000 mg | ORAL_TABLET | Freq: Every day | ORAL | Status: DC
  Administered 2021-03-25 – 2021-04-01 (×8): 325 mg via ORAL
  Filled 2021-03-24 (×8): qty 1

## 2021-03-24 MED ORDER — POLYETHYLENE GLYCOL 3350 17 G PO PACK
17.0000 g | PACK | Freq: Every day | ORAL | Status: DC
  Administered 2021-03-25 – 2021-03-27 (×3): 17 g via ORAL
  Filled 2021-03-24 (×3): qty 1

## 2021-03-24 NOTE — Progress Notes (Signed)
Subjective: 2 Days Post-Op s/p Procedure(s): TOTAL HIP ARTHROPLASTY, POSTERIOR HEMI   Patient pleasantly confused. Not oriented to person, place or time. Denies hip pain. Denies chest pain, SOB, Calf pain. No nausea/vomiting. No other complaints.   Objective:  PE: VITALS:   Vitals:   03/23/21 1707 03/23/21 2313 03/24/21 0131 03/24/21 0131  BP: (!) 100/59 (!) 94/55 118/67 118/67  Pulse: 93 99 95 95  Resp:  16 16 16   Temp: 98.2 F (36.8 C) 98.2 F (36.8 C) 98.4 F (36.9 C) 98.4 F (36.9 C)  TempSrc: Oral Oral Oral Oral  SpO2: 90% 90% 93% 93%  Weight:      Height:       Sitting up in bed. Knees bent at 90 degrees bilaterally. Dorsiflexion and plantarflexion intact. Able to flex and extend all toes. Unable to endorse sensation. 2+ DP pulse. Patient pulled off mepilex, steri strips in place. Patient became combative when trying to apply mepilex.    LABS  Results for orders placed or performed during the hospital encounter of 03/21/21 (from the past 24 hour(s))  Basic metabolic panel     Status: Abnormal   Collection Time: 03/24/21  3:07 AM  Result Value Ref Range   Sodium 135 135 - 145 mmol/L   Potassium 4.3 3.5 - 5.1 mmol/L   Chloride 101 98 - 111 mmol/L   CO2 26 22 - 32 mmol/L   Glucose, Bld 96 70 - 99 mg/dL   BUN 13 8 - 23 mg/dL   Creatinine, Ser 03/26/21 0.44 - 1.00 mg/dL   Calcium 8.3 (L) 8.9 - 10.3 mg/dL   GFR, Estimated 7.61 >60 mL/min   Anion gap 8 5 - 15  CBC with Differential/Platelet     Status: Abnormal   Collection Time: 03/24/21  3:07 AM  Result Value Ref Range   WBC 8.4 4.0 - 10.5 K/uL   RBC 3.32 (L) 3.87 - 5.11 MIL/uL   Hemoglobin 10.1 (L) 12.0 - 15.0 g/dL   HCT 03/26/21 (L) 71.0 - 62.6 %   MCV 100.3 (H) 80.0 - 100.0 fL   MCH 30.4 26.0 - 34.0 pg   MCHC 30.3 30.0 - 36.0 g/dL   RDW 94.8 54.6 - 27.0 %   Platelets 107 (L) 150 - 400 K/uL   nRBC 0.0 0.0 - 0.2 %   Neutrophils Relative % 73 %   Neutro Abs 6.2 1.7 - 7.7 K/uL   Lymphocytes Relative 13 %    Lymphs Abs 1.1 0.7 - 4.0 K/uL   Monocytes Relative 12 %   Monocytes Absolute 1.0 0.1 - 1.0 K/uL   Eosinophils Relative 1 %   Eosinophils Absolute 0.1 0.0 - 0.5 K/uL   Basophils Relative 0 %   Basophils Absolute 0.0 0.0 - 0.1 K/uL   Immature Granulocytes 1 %   Abs Immature Granulocytes 0.05 0.00 - 0.07 K/uL  Magnesium     Status: None   Collection Time: 03/24/21  3:07 AM  Result Value Ref Range   Magnesium 1.9 1.7 - 2.4 mg/dL    DG Pelvis Portable  Result Date: 03/22/2021 CLINICAL DATA:  Hip replacement EXAM: PORTABLE PELVIS 1-2 VIEWS COMPARISON:  03/21/2021 FINDINGS: Interval right hip hemiarthroplasty with intact hardware and normal alignment. Gas in the soft tissues consistent with recent surgery. IMPRESSION: Interval right hip replacement with expected postsurgical changes Electronically Signed   By: 03/23/2021 M.D.   On: 03/22/2021 17:48   DG HIP PORT UNILAT WITH PELVIS 1V RIGHT  Result Date: 03/22/2021 CLINICAL DATA:  Hip replacement EXAM: DG HIP (WITH OR WITHOUT PELVIS) 1V PORT RIGHT COMPARISON:  03/21/2021 FINDINGS: Pubic symphysis and rami are intact. Interval right hip hemiarthroplasty with intact hardware and normal alignment. Gas in the soft tissues consistent with recent surgery IMPRESSION: Right hip replacement with expected postsurgical change Electronically Signed   By: Jasmine Pang M.D.   On: 03/22/2021 17:47    Assessment/Plan: Right femoral neck fracture 2 Days Post-Op s/p Procedure(s): TOTAL HIP ARTHROPLASTY, POSTERIOR HEMI  Weightbearing: WBAT RLE Insicional and dressing care: Reinforce dressings as needed. Spoke with nursing staff about covering steri strips with a mepilex when patient less combative.  VTE prophylaxis: lovenox x 30 days Pain control: tylenol prn, tramadol for severe pain Follow - up plan: 2 weeks with Dr. Dion Saucier Dispo: patient ok to discharge back to SNF when bed available.    Contact information:   Weekdays 8-5 Janine Ores, [place]  [phone] A fter hours and holidays please check [email or web address] for group call information for [facility]  Armida Sans 03/24/2021, 11:14 AM

## 2021-03-24 NOTE — TOC Progression Note (Signed)
Transition of Care [facility]) - Progression Note    Patient Details  Name: Tamara Fisher MRN: [id number] Date of Birth: [birth year]  Transition [facility] CM/SW Contact  Amada [place], [place] Phone Number: 03/24/2021, 3:51 PM  Clinical Narrative:    Pt's' son has accepted SNF bed offer from [place] and facility has begun insurance authorization.  TOC will continue to follow and await auth confirmation.   Expected Discharge Plan: Skilled Nursing Facility Barriers to Discharge: Continued Medical Work up, English as a second language teacher  Expected Discharge Plan and Services Expected Discharge Plan: Skilled Nursing Facility In-house Referral: Clinical Social Work   Post Acute Care Choice: Skilled Nursing Facility Living arrangements for the past 2 months: Assisted Press photographer (memory care at [employer])                 DME Arranged: N/A DME Agency: NA                   Social Determinants of Health (SDOH) Interventions    Readmission Risk Interventions No flowsheet data found.

## 2021-03-24 NOTE — Plan of Care (Signed)
  Problem: Coping: Goal: Level of anxiety will decrease Outcome: Progressing   Problem: Pain Managment: Goal: General experience of comfort will improve Outcome: Progressing   Problem: Safety: Goal: Ability to remain free from injury will improve Outcome: Progressing   

## 2021-03-24 NOTE — Progress Notes (Signed)
PROGRESS NOTE    [name]  BMW:[id number] DOB: September 29, 1941 DOA: 03/21/2021 PCP: Medicine, [facility]  Outpatient Specialists: none    Brief Narrative:   Tamara Fisher is a 80 y.o. female with medical history significant for advanced dementia, who presents with the above.   Witnessed mechanical fall at her facility earlier today. Reportedly usual state of health. Is somewhat ambulatory at baseline. Wound to right eye. Not complaining of pain. Does have baseline behavioral disturbance.   Assessment & Plan:   Principal Problem:   Hip fracture [facility]) Active Problems:   Hypothyroidism   Lupus (HCC)   Vascular dementia with behavior disturbance (HCC)   # Acute right femoral neck fracture S/p total hip arthroplasty on 6/21  -Postop pain control, weightbearing status, wound care , postop DVT prophylaxis per Ortho l -Skilled nursing facility placement, awaiting for a bed   # Subdural hematoma Small, seen on CT. EDP discussed w/ neurosurgery who advised monitoring neurologic status, no further intervention if no neurologic changes.   # Laceration R eyebrow, CT face neg for fracture, hemostatic - local wound care   # Hypothyroid - cont home synthroid   # Dementia with behavioral disturbance - cont home depakote, haldol, lorazepam, zoloft - delirium precautions -Speech recommended soft diet, thin liquid -Continue IV hydration for another 24 hours, she will appear to have poor oral intake and dehydrated currently   # Lupus Appears quiescent   # CHF? Saw [facility] cardiology in 2018, given provisional dx of chf based on x-ray findings. TTE and nuclear stress tests ordered, results not available, son says that w/u was unremarkable, no signs fluid overload on exam - monitor -She will appear dehydrated, on gentle hydration  FTT: will need snf placement and likely will benefit from palliative care following at snf   DVT prophylaxis: lovenox Code  Status: dnr Family Communication: None at bedside  Level of care: Med-Surg Status is: Inpatient  Remains inpatient appropriate because:Inpatient level of care appropriate due to severity of illness  Dispo: The patient is from: SNF              Anticipated d/c is to: SNF with palliative care              Patient currently medically stable to discharge to snf, awaiting for insurance approval for snf   Difficult to place patient No    Consultants:  Orthopedic surgery  Procedures: Total hip arthroplasty right  Antimicrobials:  Ancef perioperative    Subjective: PoD#2, awake, alert, demented, appear more interactive, she is only oriented to self, she does not remember her birthday,  She does not appear in pain.  She is stable medically, awaiting for snf bed   Objective: Vitals:   03/23/21 1707 03/23/21 2313 03/24/21 0131 03/24/21 0131  BP: (!) 100/59 (!) 94/55 118/67 118/67  Pulse: 93 99 95 95  Resp:  16 16 16   Temp: 98.2 F (36.8 C) 98.2 F (36.8 C) 98.4 F (36.9 C) 98.4 F (36.9 C)  TempSrc: Oral Oral Oral Oral  SpO2: 90% 90% 93% 93%  Weight:      Height:        Intake/Output Summary (Last 24 hours) at 03/24/2021 1025 Last data filed at 03/24/2021 0600 Gross per 24 hour  Intake 776.49 ml  Output 1650 ml  Net -873.51 ml   Filed Weights   03/21/21 0843  Weight: 68 kg    Examination:  Constitutional: No acute distress, demented, currently no agitation Head:  bruise right eye with laceration that is glued closed Eyes: Conjunctiva clear ENM: Moist mucous membranes.   Neck: Supple Respiratory: Clear to auscultation bilaterally, no wheezing/rales/rhonchi. Normal respiratory effort. No accessory muscle use. . Cardiovascular: Regular rate and rhythm. Soft systolic murmur Abdomen: Non-tender, non-distended. No masses. No rebound or guarding. Positive bowel sounds. Musculoskeletal: No joint deformity upper and lower extremities. Normal ROM, no contractures. Normal  muscle tone. Skin: No rashes, lesions, or ulcers. Sacrum examined Extremities: right hip post op changes, neurovascular intact distally  Neurologic: awake, demented, no agitation, does not follow commands, mumbles, does not seems to understand questions Psychiatric: unable to assess   Data Reviewed: I have personally reviewed following labs and imaging studies  CBC: Recent Labs  Lab 03/21/21 1018 03/23/21 0303 03/24/21 0307  WBC 10.1 11.0* 8.4  NEUTROABS 8.8*  --  6.2  HGB 14.0 10.4* 10.1*  HCT 43.1 31.8* 33.3*  MCV 94.9 94.4 100.3*  PLT 157 131* 107*   Basic Metabolic Panel: Recent Labs  Lab 03/21/21 1018 03/23/21 0303 03/24/21 0307  NA 139 134* 135  K 4.3 4.4 4.3  CL 102 101 101  CO2 29 27 26   GLUCOSE 122* 119* 96  BUN 20 20 13   CREATININE 0.48 0.86 0.54  CALCIUM 9.5 8.4* 8.3*  MG  --   --  1.9   GFR: Estimated Creatinine Clearance: 53.4 mL/min (by C-G formula based on SCr of 0.54 mg/dL). Liver Function Tests: No results for input(s): AST, ALT, ALKPHOS, BILITOT, PROT, ALBUMIN in the last 168 hours. No results for input(s): LIPASE, AMYLASE in the last 168 hours. No results for input(s): AMMONIA in the last 168 hours. Coagulation Profile: No results for input(s): INR, PROTIME in the last 168 hours. Cardiac Enzymes: No results for input(s): CKTOTAL, CKMB, CKMBINDEX, TROPONINI in the last 168 hours. BNP (last 3 results) No results for input(s): PROBNP in the last 8760 hours. HbA1C: No results for input(s): HGBA1C in the last 72 hours. CBG: No results for input(s): GLUCAP in the last 168 hours. Lipid Profile: No results for input(s): CHOL, HDL, LDLCALC, TRIG, CHOLHDL, LDLDIRECT in the last 72 hours. Thyroid Function Tests: No results for input(s): TSH, T4TOTAL, FREET4, T3FREE, THYROIDAB in the last 72 hours. Anemia Panel: No results for input(s): VITAMINB12, FOLATE, FERRITIN, TIBC, IRON, RETICCTPCT in the last 72 hours. Urine analysis:    Component Value  Date/Time   COLORURINE YELLOW 10/17/2018 0755   APPEARANCEUR HAZY (A) 10/17/2018 0755   LABSPEC 1.024 10/17/2018 0755   PHURINE 5.0 10/17/2018 0755   GLUCOSEU NEGATIVE 10/17/2018 0755   HGBUR NEGATIVE 10/17/2018 0755   BILIRUBINUR NEGATIVE 10/17/2018 0755   KETONESUR NEGATIVE 10/17/2018 0755   PROTEINUR NEGATIVE 10/17/2018 0755   NITRITE NEGATIVE 10/17/2018 0755   LEUKOCYTESUR LARGE (A) 10/17/2018 0755   Sepsis Labs: @LABRCNTIP (procalcitonin:4,lacticidven:4)  ) Recent Results (from the past 240 hour(s))  Resp Panel by RT-PCR (Flu A&B, Covid) Nasopharyngeal Swab     Status: None   Collection Time: 03/21/21 10:17 AM   Specimen: Nasopharyngeal Swab; Nasopharyngeal(NP) swabs in vial transport medium  Result Value Ref Range Status   SARS Coronavirus 2 by RT PCR NEGATIVE NEGATIVE Final    Comment: (NOTE) SARS-CoV-2 target nucleic acids are NOT DETECTED.  The SARS-CoV-2 RNA is generally detectable in upper respiratory specimens during the acute phase of infection. The lowest concentration of SARS-CoV-2 viral copies this assay can detect is 138 copies/mL. A negative result does not preclude SARS-Cov-2 infection and should not be used as  the sole basis for treatment or other patient management decisions. A negative result may occur with  improper specimen collection/handling, submission of specimen other than nasopharyngeal swab, presence of viral mutation(s) within the areas targeted by this assay, and inadequate number of viral copies(<138 copies/mL). A negative result must be combined with clinical observations, patient history, and epidemiological information. The expected result is Negative.  Fact Sheet for Patients:  BloggerCourse.com  Fact Sheet for Healthcare Providers:  SeriousBroker.it  This test is no t yet approved or cleared by the [place] FDA and  has been authorized for detection and/or diagnosis of SARS-CoV-2  by FDA under an Emergency Use Authorization (EUA). This EUA will remain  in effect (meaning this test can be used) for the duration of the COVID-19 declaration under Section 564(b)(1) of the Act, 21 U.S.C.section 360bbb-3(b)(1), unless the authorization is terminated  or revoked sooner.       Influenza A by PCR NEGATIVE NEGATIVE Final   Influenza B by PCR NEGATIVE NEGATIVE Final    Comment: (NOTE) The Xpert Xpress SARS-CoV-2/FLU/RSV plus assay is intended as an aid in the diagnosis of influenza from Nasopharyngeal swab specimens and should not be used as a sole basis for treatment. Nasal washings and aspirates are unacceptable for Xpert Xpress SARS-CoV-2/FLU/RSV testing.  Fact Sheet for Patients: BloggerCourse.com  Fact Sheet for Healthcare Providers: SeriousBroker.it  This test is not yet approved or cleared by the [place] FDA and has been authorized for detection and/or diagnosis of SARS-CoV-2 by FDA under an Emergency Use Authorization (EUA). This EUA will remain in effect (meaning this test can be used) for the duration of the COVID-19 declaration under Section 564(b)(1) of the Act, 21 U.S.C. section 360bbb-3(b)(1), unless the authorization is terminated or revoked.  Performed at [facility], [address], [place], [place] [address]          Radiology Studies: DG Pelvis Portable  Result Date: 03/22/2021 CLINICAL DATA:  Hip replacement EXAM: PORTABLE PELVIS 1-2 VIEWS COMPARISON:  03/21/2021 FINDINGS: Interval right hip hemiarthroplasty with intact hardware and normal alignment. Gas in the soft tissues consistent with recent surgery. IMPRESSION: Interval right hip replacement with expected postsurgical changes Electronically Signed   By: Jasmine Pang M.D.   On: 03/22/2021 17:48   DG HIP PORT UNILAT WITH PELVIS 1V RIGHT  Result Date: 03/22/2021 CLINICAL DATA:  Hip replacement EXAM: DG HIP  (WITH OR WITHOUT PELVIS) 1V PORT RIGHT COMPARISON:  03/21/2021 FINDINGS: Pubic symphysis and rami are intact. Interval right hip hemiarthroplasty with intact hardware and normal alignment. Gas in the soft tissues consistent with recent surgery IMPRESSION: Right hip replacement with expected postsurgical change Electronically Signed   By: Jasmine Pang M.D.   On: 03/22/2021 17:47        Scheduled Meds:  divalproex  250 mg Oral Q8H   docusate sodium  100 mg Oral BID   enoxaparin (LOVENOX) injection  40 mg Subcutaneous Q24H   ferrous sulfate  325 mg Oral TID PC   haloperidol  1 mg Oral TID   LORazepam  1 mg Intramuscular Once   LORazepam  0.5 mg Oral BID   senna  1 tablet Oral BID   sertraline  100 mg Oral Daily   Continuous Infusions:  lactated ringers 75 mL/hr at 03/23/21 2112     LOS: 3 days    Time spent: 15 min    Albertine Grates, MD PhD FACP [facility]   If 7PM-7AM, please contact night-coverage [email or web address]  Password [email or web address] 03/24/2021, 10:25 AM

## 2021-03-25 LAB — CBC
HCT: 28.2 % — ABNORMAL LOW (ref 36.0–46.0)
Hemoglobin: 9.3 g/dL — ABNORMAL LOW (ref 12.0–15.0)
MCH: 31 pg (ref 26.0–34.0)
MCHC: 33 g/dL (ref 30.0–36.0)
MCV: 94 fL (ref 80.0–100.0)
Platelets: 146 10*3/uL — ABNORMAL LOW (ref 150–400)
RBC: 3 MIL/uL — ABNORMAL LOW (ref 3.87–5.11)
RDW: 13.5 % (ref 11.5–15.5)
WBC: 7.2 10*3/uL (ref 4.0–10.5)
nRBC: 0 % (ref 0.0–0.2)

## 2021-03-25 MED ORDER — LACTATED RINGERS IV SOLN: INTRAVENOUS | Status: AC

## 2021-03-25 MED ORDER — ENOXAPARIN SODIUM 40 MG/0.4ML IJ SOSY: 40.0000 mg | PREFILLED_SYRINGE | INTRAMUSCULAR | 0 refills | Status: AC

## 2021-03-25 MED ORDER — FLEET ENEMA 7-19 GM/118ML RE ENEM: 1.0000 | ENEMA | Freq: Once | RECTAL | Status: DC | PRN

## 2021-03-25 MED ORDER — TRAMADOL HCL 50 MG PO TABS: 50.0000 mg | ORAL_TABLET | Freq: Four times a day (QID) | ORAL | 0 refills | Status: AC | PRN

## 2021-03-25 NOTE — Progress Notes (Signed)
  Speech Language Pathology Treatment: Dysphagia  Patient Details Name: Kalyna Paolella MRN: [id number] DOB: 11/06/1940 Today's Date: 03/25/2021 Time: 8485-9276 SLP Time Calculation (min) (ACUTE ONLY): 11 min  Assessment / Plan / Recommendation Clinical Impression  Pt is doing well tolerating current diet - dysphagia 3, thin liquids- with no s/s of aspiration and adequate mastication of solid consistencies.  Breath sounds are clear.  She needs assistance and encouragement but is able to hold the cup and drink with right hand; needs help with solid foods.  Primary obstacle is related to dementia -inattention, decreased initiation, and being in unfamiliar environment.  Please assist with meals; give meds whole in puree.  There are no further SLP needs - our service will sign off.   HPI HPI: 80 yo female with h/o dementia and behavioral disturbance adm to [facility] after fall on 6/20 - - pt underwent surgery for right hip fx on 6/21.  Swallow eval ordered.      SLP Plan  All goals met       Recommendations  Diet recommendations: Dysphagia 3 (mechanical soft);Thin liquid Liquids provided via: Cup;Straw Medication Administration: Whole meds with puree Supervision: Staff to assist with self feeding Compensations: Minimize environmental distractions                Oral Care Recommendations: Oral care BID Follow up Recommendations: Skilled Nursing facility SLP Visit Diagnosis: Dysphagia, unspecified (R13.10) Plan: All goals met       GO               Noelle Hoogland L. Tivis Ringer, [place] CCC/SLP Acute Rehabilitation Services Office number [phone] Pager [phone]  Assunta Curtis 03/25/2021, 2:49 PM

## 2021-03-25 NOTE — Progress Notes (Signed)
Physical Therapy Treatment Patient Details Name: Tamara Fisher MRN: [id number] DOB: 31-Dec-1940 Today's Date: 03/25/2021    History of Present Illness 80 yo female admitted6/20/22 after a fall at  [facility], sustained dispaced  right femoral neck fracture, S/P Posterior hemiarthroplasy on 03/22/21. IEP:[id number]    PT Comments    Pt agreeable to participation but with minimal follow through on any tasks.  Pt assisted to bedside sitting and with time progressed to sitting at supervision level.  Pt assisted to standing x 2 but requiring assist of two to attain and maintain upright standing.  Follow Up Recommendations  SNF     Equipment Recommendations  None recommended by PT    Recommendations for Other Services       Precautions / Restrictions Precautions Precautions: Posterior Hip;Fall Precaution Booklet Issued: No Precaution Comments: patient with advanced dementia, not following precautions at bed level Restrictions Weight Bearing Restrictions: No RLE Weight Bearing: Weight bearing as tolerated    Mobility  Bed Mobility Overal bed mobility: Needs Assistance Bed Mobility: Supine to Sit;Sit to Supine     Supine to sit: +2 for safety/equipment;+2 for physical assistance;Total assist Sit to supine: Total assist   General bed mobility comments: assist with legs and trunk to sit up and return to supine    Transfers Overall transfer level: Needs assistance Equipment used: Rolling walker (2 wheeled);2 person hand held assist Transfers: Sit to/from Stand Sit to Stand: Max assist;+2 physical assistance;+2 safety/equipment         General transfer comment: Increased time with cues for LE management and use of UEs to self assist.  Physical assist to bring wt up and fwd and to balance in standing.  Pt stood x 2 with assist to allow performance of hygiene.  Pt unable/unwilling to place R heel on floor in standing.  Ambulation/Gait             General Gait  Details: stood x 2 but pt unable to initiate step with either foot and unable/unwilling to bring heel to floor on R   Stairs             Wheelchair Mobility    Modified Rankin (Stroke Patients Only)       Balance Overall balance assessment: History of Falls;Needs assistance Sitting-balance support: Feet supported;Bilateral upper extremity supported Sitting balance-Leahy Scale: Poor Sitting balance - Comments: initial posterior lean but with increased time progressed to balancing at sup level Postural control: Posterior lean Standing balance support: During functional activity;Bilateral upper extremity supported Standing balance-Leahy Scale: Zero Standing balance comment: +2 assist to attain and maintain standing upright                            Cognition Arousal/Alertness: Awake/alert Behavior During Therapy: Flat affect Overall Cognitive Status: History of cognitive impairments - at baseline                                 General Comments: history of advanced dementia; does not inititate mobility with multimodal cues      Exercises      General Comments        Pertinent Vitals/Pain Pain Assessment: Faces Faces Pain Scale: No hurt Pain Intervention(s): Limited activity within patient's tolerance;Monitored during session    Home Living  Prior Function            PT Goals (current goals can now be found in the care plan section) Acute Rehab PT Goals Patient Stated Goal: patient unable PT Goal Formulation: Patient unable to participate in goal setting Time For Goal Achievement: 04/06/21 Potential to Achieve Goals: Fair Progress towards PT goals: Progressing toward goals    Frequency    Min 2X/week      PT Plan Current plan remains appropriate    Co-evaluation              AM-PAC PT "6 Clicks" Mobility   Outcome Measure  Help needed turning from your back to your side while in a  flat bed without using bedrails?: Total Help needed moving from lying on your back to sitting on the side of a flat bed without using bedrails?: Total Help needed moving to and from a bed to a chair (including a wheelchair)?: Total Help needed standing up from a chair using your arms (e.g., wheelchair or bedside chair)?: Total Help needed to walk in hospital room?: Total Help needed climbing 3-5 steps with a railing? : Total 6 Click Score: 6    End of Session Equipment Utilized During Treatment: Gait belt Activity Tolerance: Patient tolerated treatment well Patient left: in bed;with call bell/phone within reach;with bed alarm set Nurse Communication: Mobility status PT Visit Diagnosis: Unsteadiness on feet (R26.81);Muscle weakness (generalized) (M62.81)     Time: 1062-6948 PT Time Calculation (min) (ACUTE ONLY): 23 min  Charges:  $Therapeutic Activity: 23-37 mins                     Mauro Kaufmann PT Acute Rehabilitation Services Pager [phone] Office [phone]    Karissa Meenan 03/25/2021, 1:03 PM

## 2021-03-25 NOTE — Progress Notes (Signed)
Rounded on patient. Patient laying in bed, mild distress noted upon entering. Patient had pulled out piv from left fa, tip intact. Patient had also pulled on foley catheter tubing resulting in the catheter breaking off between bag tubing and balloon port. Incont bm also present. Staff x4 proceeded to remove residual foley tubing, attend to hygiene and linen change and reinsert PIV. Extra attention paid to pericare r/t open catheter and bm material. Foley replaced after thorough pericare. Patient positioned for comfort and untied mittens placed to protect tubings. Report given to oncoming RN and CN.

## 2021-03-25 NOTE — Progress Notes (Signed)
     Subjective: 3 Days Post-Op s/p Procedure(s): TOTAL HIP ARTHROPLASTY, POSTERIOR HEMI   Patient pleasantly confused. Not oriented to person, place or time. States "im in pain" this morning, but unable to locate it. No other complaints.   Objective:  PE: VITALS:   Vitals:   03/24/21 0131 03/24/21 1400 03/24/21 2106 03/25/21 0548  BP: 118/67 127/76 118/60 (!) 152/89  Pulse: 95 (!) 59 80 (!) 102  Resp: 16 16 17 18   Temp: 98.4 F (36.9 C) 98.5 F (36.9 C) 98.7 F (37.1 C) 97.9 F (36.6 C)  TempSrc: Oral Oral Oral Oral  SpO2: 93% 99% 90% 91%  Weight:      Height:       Laying in bed. Dorsiflexion and plantarflexion intact. Able to flex and extend all toes. Unable to endorse sensation. 2+ DP pulse. Mepilex dressing intact without drainage.    LABS  Results for orders placed or performed during the hospital encounter of 03/21/21 (from the past 24 hour(s))  CBC     Status: Abnormal   Collection Time: 03/25/21  3:05 AM  Result Value Ref Range   WBC 7.2 4.0 - 10.5 K/uL   RBC 3.00 (L) 3.87 - 5.11 MIL/uL   Hemoglobin 9.3 (L) 12.0 - 15.0 g/dL   HCT 03/27/21 (L) 08.6 - 57.8 %   MCV 94.0 80.0 - 100.0 fL   MCH 31.0 26.0 - 34.0 pg   MCHC 33.0 30.0 - 36.0 g/dL   RDW 46.9 62.9 - 52.8 %   Platelets 146 (L) 150 - 400 K/uL   nRBC 0.0 0.0 - 0.2 %    No results found.  Assessment/Plan: Right femoral neck fracture 3 Days Post-Op s/p Procedure(s): TOTAL HIP ARTHROPLASTY, POSTERIOR HEMI  Weightbearing: WBAT RLE Insicional and dressing care: Reinforce dressings as needed.  VTE prophylaxis: lovenox x 30 days Pain control: tylenol prn, tramadol for severe pain Follow - up plan: 2 weeks with Dr. 41.3 Dispo: patient ok to discharge back to SNF from ortho standpoint when bed available.    Contact information:   Weekdays 8-5 06-05-2006, Janine Ores [place] A fter hours and holidays please check [email or web address] for group call information for [facility]  [phone] 03/25/2021,  8:32 AM

## 2021-03-25 NOTE — Progress Notes (Signed)
PROGRESS NOTE    Tamara Fisher  XYI:[id number] DOB: 1941-03-11 DOA: 03/21/2021 PCP: Medicine, [facility]  Outpatient Specialists: none    Brief Narrative:   Tamara Fisher is a 80 y.o. female with medical history significant for advanced dementia, who presents with the above.   Witnessed mechanical fall at her facility earlier today. Reportedly usual state of health. Is somewhat ambulatory at baseline. Wound to right eye. Not complaining of pain. Does have baseline behavioral disturbance.   Assessment & Plan:   Principal Problem:   Hip fracture [facility]) Active Problems:   Hypothyroidism   Lupus (HCC)   Vascular dementia with behavior disturbance (HCC)   # Acute right femoral neck fracture S/p total hip arthroplasty on 6/21  -Postop pain control, weightbearing status, wound care , postop DVT prophylaxis per Ortho  -Skilled nursing facility placement, awaiting for a bed   # Subdural hematoma Small, seen on CT. EDP discussed w/ neurosurgery who advised monitoring neurologic status, no further intervention if no neurologic changes.   # Laceration R eyebrow, CT face neg for fracture, hemostatic - local wound care   # Hypothyroid - cont home synthroid   # Dementia with behavioral disturbance - cont home depakote, haldol, lorazepam, zoloft - delirium precautions -Speech recommended soft diet, thin liquid -Continue IV hydration for another 24 hours, continue to have  poor oral intake ate 25-30% of her meal   # Lupus Appears quiescent   # CHF? Saw [facility] cardiology in 2018, given provisional dx of chf based on x-ray findings. TTE and nuclear stress tests ordered, results not available, son says that w/u was unremarkable, no signs fluid overload on exam - monitor -She will appear dehydrated, on gentle hydration  FTT: snf placement and likely will benefit from palliative care following at snf  Developed urinary retention last night, Foley reinserted,  start Flomax, increase activity, prevent constipation, will do voiding trial prior to discharge   DVT prophylaxis: lovenox Code Status: dnr Family Communication: None at bedside  Level of care: Med-Surg Status is: Inpatient  Remains inpatient appropriate because:Inpatient level of care appropriate due to severity of illness  Dispo: The patient is from: SNF              Anticipated d/c is to: SNF with palliative care              Patient currently medically stable to discharge to snf, awaiting for insurance approval for snf   Difficult to place patient No    Consultants:  Orthopedic surgery  Procedures: Total hip arthroplasty right  Antimicrobials:  Ancef perioperative    Subjective: PoD#3,  awake, alert, demented,  she is only oriented to self, she does not remember her birthday,  She does not appear in pain.  Speech therapist in room She is stable medically, awaiting for snf bed   Objective: Vitals:   03/24/21 1400 03/24/21 2106 03/25/21 0548 03/25/21 1444  BP: 127/76 118/60 (!) 152/89 (!) 109/51  Pulse: (!) 59 80 (!) 102 72  Resp: 16 17 18 16   Temp: 98.5 F (36.9 C) 98.7 F (37.1 C) 97.9 F (36.6 C) 97.7 F (36.5 C)  TempSrc: Oral Oral Oral Oral  SpO2: 99% 90% 91% 93%  Weight:      Height:        Intake/Output Summary (Last 24 hours) at 03/25/2021 1651 Last data filed at 03/25/2021 1318 Gross per 24 hour  Intake 240 ml  Output 1600 ml  Net -1360 ml  Filed Weights   03/21/21 0843  Weight: 68 kg    Examination:  Constitutional: No acute distress, demented, currently no agitation Head: bruise right eye with laceration that is glued closed Eyes: Conjunctiva clear ENM: Moist mucous membranes.   Neck: Supple Respiratory: Clear to auscultation bilaterally, no wheezing/rales/rhonchi. Normal respiratory effort. No accessory muscle use. . Cardiovascular: Regular rate and rhythm. Soft systolic murmur Abdomen: Non-tender, non-distended. No masses. No  rebound or guarding. Positive bowel sounds. Musculoskeletal: No joint deformity upper and lower extremities. Normal ROM, no contractures. Normal muscle tone. Skin: No rashes, lesions, or ulcers. Sacrum examined Extremities: right hip post op changes, neurovascular intact distally  Neurologic: awake, demented, no agitation, does not follow commands, mumbles, does not seems to understand questions Psychiatric: unable to assess   Data Reviewed: I have personally reviewed following labs and imaging studies  CBC: Recent Labs  Lab 03/21/21 1018 03/23/21 0303 03/24/21 0307 03/25/21 0305  WBC 10.1 11.0* 8.4 7.2  NEUTROABS 8.8*  --  6.2  --   HGB 14.0 10.4* 10.1* 9.3*  HCT 43.1 31.8* 33.3* 28.2*  MCV 94.9 94.4 100.3* 94.0  PLT 157 131* 107* 146*   Basic Metabolic Panel: Recent Labs  Lab 03/21/21 1018 03/23/21 0303 03/24/21 0307  NA 139 134* 135  K 4.3 4.4 4.3  CL 102 101 101  CO2 29 27 26   GLUCOSE 122* 119* 96  BUN 20 20 13   CREATININE 0.48 0.86 0.54  CALCIUM 9.5 8.4* 8.3*  MG  --   --  1.9   GFR: Estimated Creatinine Clearance: 53.4 mL/min (by C-G formula based on SCr of 0.54 mg/dL). Liver Function Tests: No results for input(s): AST, ALT, ALKPHOS, BILITOT, PROT, ALBUMIN in the last 168 hours. No results for input(s): LIPASE, AMYLASE in the last 168 hours. No results for input(s): AMMONIA in the last 168 hours. Coagulation Profile: No results for input(s): INR, PROTIME in the last 168 hours. Cardiac Enzymes: No results for input(s): CKTOTAL, CKMB, CKMBINDEX, TROPONINI in the last 168 hours. BNP (last 3 results) No results for input(s): PROBNP in the last 8760 hours. HbA1C: No results for input(s): HGBA1C in the last 72 hours. CBG: No results for input(s): GLUCAP in the last 168 hours. Lipid Profile: No results for input(s): CHOL, HDL, LDLCALC, TRIG, CHOLHDL, LDLDIRECT in the last 72 hours. Thyroid Function Tests: No results for input(s): TSH, T4TOTAL, FREET4,  T3FREE, THYROIDAB in the last 72 hours. Anemia Panel: No results for input(s): VITAMINB12, FOLATE, FERRITIN, TIBC, IRON, RETICCTPCT in the last 72 hours. Urine analysis:    Component Value Date/Time   COLORURINE YELLOW 10/17/2018 0755   APPEARANCEUR HAZY (A) 10/17/2018 0755   LABSPEC 1.024 10/17/2018 0755   PHURINE 5.0 10/17/2018 0755   GLUCOSEU NEGATIVE 10/17/2018 0755   HGBUR NEGATIVE 10/17/2018 0755   BILIRUBINUR NEGATIVE 10/17/2018 0755   KETONESUR NEGATIVE 10/17/2018 0755   PROTEINUR NEGATIVE 10/17/2018 0755   NITRITE NEGATIVE 10/17/2018 0755   LEUKOCYTESUR LARGE (A) 10/17/2018 0755   Sepsis Labs: @LABRCNTIP (procalcitonin:4,lacticidven:4)  ) Recent Results (from the past 240 hour(s))  Resp Panel by RT-PCR (Flu A&B, Covid) Nasopharyngeal Swab     Status: None   Collection Time: 03/21/21 10:17 AM   Specimen: Nasopharyngeal Swab; Nasopharyngeal(NP) swabs in vial transport medium  Result Value Ref Range Status   SARS Coronavirus 2 by RT PCR NEGATIVE NEGATIVE Final    Comment: (NOTE) SARS-CoV-2 target nucleic acids are NOT DETECTED.  The SARS-CoV-2 RNA is generally detectable in upper respiratory specimens  during the acute phase of infection. The lowest concentration of SARS-CoV-2 viral copies this assay can detect is 138 copies/mL. A negative result does not preclude SARS-Cov-2 infection and should not be used as the sole basis for treatment or other patient management decisions. A negative result may occur with  improper specimen collection/handling, submission of specimen other than nasopharyngeal swab, presence of viral mutation(s) within the areas targeted by this assay, and inadequate number of viral copies(<138 copies/mL). A negative result must be combined with clinical observations, patient history, and epidemiological information. The expected result is Negative.  Fact Sheet for Patients:  BloggerCourse.com  Fact Sheet for Healthcare  Providers:  SeriousBroker.it  This test is no t yet approved or cleared by the [place] FDA and  has been authorized for detection and/or diagnosis of SARS-CoV-2 by FDA under an Emergency Use Authorization (EUA). This EUA will remain  in effect (meaning this test can be used) for the duration of the COVID-19 declaration under Section 564(b)(1) of the Act, 21 U.S.C.section 360bbb-3(b)(1), unless the authorization is terminated  or revoked sooner.       Influenza A by PCR NEGATIVE NEGATIVE Final   Influenza B by PCR NEGATIVE NEGATIVE Final    Comment: (NOTE) The Xpert Xpress SARS-CoV-2/FLU/RSV plus assay is intended as an aid in the diagnosis of influenza from Nasopharyngeal swab specimens and should not be used as a sole basis for treatment. Nasal washings and aspirates are unacceptable for Xpert Xpress SARS-CoV-2/FLU/RSV testing.  Fact Sheet for Patients: BloggerCourse.com  Fact Sheet for Healthcare Providers: SeriousBroker.it  This test is not yet approved or cleared by the [place] FDA and has been authorized for detection and/or diagnosis of SARS-CoV-2 by FDA under an Emergency Use Authorization (EUA). This EUA will remain in effect (meaning this test can be used) for the duration of the COVID-19 declaration under Section 564(b)(1) of the Act, 21 U.S.C. section 360bbb-3(b)(1), unless the authorization is terminated or revoked.  Performed at [facility], [address] [place], [place] [address]          Radiology Studies: No results found.      Scheduled Meds:  divalproex  250 mg Oral Q8H   docusate sodium  100 mg Oral BID   enoxaparin (LOVENOX) injection  40 mg Subcutaneous Q24H   ferrous sulfate  325 mg Oral Q breakfast   haloperidol  1 mg Oral TID   LORazepam  1 mg Intramuscular Once   LORazepam  0.5 mg Oral BID   polyethylene glycol  17 g Oral  Daily   senna  1 tablet Oral BID   sertraline  100 mg Oral Daily   sodium phosphate  1 enema Rectal Once   tamsulosin  0.4 mg Oral QPC supper   Continuous Infusions:     LOS: 4 days    Time spent: 15 min    Albertine Grates, MD PhD FACP [facility]   If 7PM-7AM, please contact night-coverage [email or web address] Password [facility] 03/25/2021, 4:51 PM

## 2021-03-26 NOTE — Plan of Care (Signed)
  Problem: Nutrition: Goal: Adequate nutrition will be maintained Outcome: Progressing   Problem: Elimination: Goal: Will not experience complications related to urinary retention Outcome: Progressing   Problem: Health Behavior/Discharge Planning: Goal: Ability to manage health-related needs will improve Outcome: Progressing

## 2021-03-26 NOTE — Plan of Care (Signed)
  Problem: Activity: Goal: Risk for activity intolerance will decrease Outcome: Progressing   Problem: Nutrition: Goal: Adequate nutrition will be maintained Outcome: Progressing   Problem: Elimination: Goal: Will not experience complications related to urinary retention Outcome: Progressing   

## 2021-03-26 NOTE — Progress Notes (Signed)
PROGRESS NOTE    Lyrika Souders  HQI:[id number] DOB: 1941-08-01 DOA: 03/21/2021 PCP: Medicine, [facility]  Outpatient Specialists: none    Brief Narrative:   Tamara Fisher is a 80 y.o. female with medical history significant for advanced dementia, who presents with the above.   Witnessed mechanical fall at her facility earlier today. Reportedly usual state of health. Is somewhat ambulatory at baseline. Wound to right eye. Not complaining of pain. Does have baseline behavioral disturbance.   Assessment & Plan:   Principal Problem:   Hip fracture [facility]) Active Problems:   Hypothyroidism   Lupus (HCC)   Vascular dementia with behavior disturbance (HCC)   # Acute right femoral neck fracture S/p total hip arthroplasty on 6/21  -Postop pain control, weightbearing status, wound care , postop DVT prophylaxis per Ortho  -Skilled nursing facility placement, awaiting for a bed   # Subdural hematoma Small, seen on CT. EDP discussed w/ neurosurgery who advised monitoring neurologic status, no further intervention if no neurologic changes.   # Laceration R eyebrow, CT face neg for fracture, hemostatic - local wound care, healing    # Hypothyroid - cont home synthroid   # Dementia with behavioral disturbance - cont home depakote, haldol, lorazepam, zoloft - delirium precautions -Speech recommended soft diet, thin liquid -oral intake improved some, ate 50% of her breakfast this am on 6/25, will d/c ivf, monitor oral intake     # Lupus Appears quiescent   # CHF? Saw [facility] cardiology in 2018, given provisional dx of chf based on x-ray findings. TTE and nuclear stress tests ordered, results not available, son says that w/u was unremarkable, no signs fluid overload on exam - monitor -received  hydration due to poor oral intake and dehydration, off ivf on 6/25 Monitor volume status   FTT: snf placement and likely will benefit from palliative care following  at snf  Developed urinary retention on 6/23 , 6/24 night, Foley reinserted, started Flomax, increase activity, prevent constipation, will do voiding trial on 6/26 am   DVT prophylaxis: lovenox Code Status: dnr Family Communication: None at bedside  Level of care: Med-Surg Status is: Inpatient  Remains inpatient appropriate because:Inpatient level of care appropriate due to severity of illness  Dispo: The patient is from: SNF              Anticipated d/c is to: SNF with palliative care              Patient currently medically stable to discharge to snf, awaiting for insurance approval for snf   Difficult to place patient No    Consultants:  Orthopedic surgery  Procedures: Total hip arthroplasty right  Antimicrobials:  Ancef perioperative    Subjective: PoD#4, she appears slightly stronger and more interactive, she is able tell me her birthdate She denies pain  She is stable medically, awaiting for snf bed   Objective: Vitals:   03/25/21 0548 03/25/21 1444 03/25/21 2226 03/26/21 0513  BP: (!) 152/89 (!) 109/51 (!) 114/56 134/78  Pulse: (!) 102 72 72 77  Resp: 18 16 16 18   Temp: 97.9 F (36.6 C) 97.7 F (36.5 C) 98.6 F (37 C) 98.4 F (36.9 C)  TempSrc: Oral Oral Oral Oral  SpO2: 91% 93% 94%   Weight:      Height:        Intake/Output Summary (Last 24 hours) at 03/26/2021 1231 Last data filed at 03/26/2021 1000 Gross per 24 hour  Intake 1393.78 ml  Output  1100 ml  Net 293.78 ml   Filed Weights   03/21/21 0843  Weight: 68 kg    Examination:  Constitutional: No acute distress, demented, currently no agitation Head: bruise right eye with laceration that is glued closed, now healing  Eyes: Conjunctiva clear ENM: Moist mucous membranes.   Neck: Supple Respiratory: Clear to auscultation bilaterally, no wheezing/rales/rhonchi. Normal respiratory effort. No accessory muscle use. . Cardiovascular: Regular rate and rhythm. Soft systolic murmur Abdomen:  Non-tender, non-distended. No masses. No rebound or guarding. Positive bowel sounds. Musculoskeletal: No joint deformity upper and lower extremities. Normal ROM, no contractures. Normal muscle tone. Skin: No rashes, lesions, or ulcers. Sacrum examined Extremities: right hip post op changes, neurovascular intact distally  Neurologic: awake, demented, no agitation, more interactive Psychiatric: calm and cooperative, smiling during conversation    Data Reviewed: I have personally reviewed following labs and imaging studies  CBC: Recent Labs  Lab 03/21/21 1018 03/23/21 0303 03/24/21 0307 03/25/21 0305  WBC 10.1 11.0* 8.4 7.2  NEUTROABS 8.8*  --  6.2  --   HGB 14.0 10.4* 10.1* 9.3*  HCT 43.1 31.8* 33.3* 28.2*  MCV 94.9 94.4 100.3* 94.0  PLT 157 131* 107* 146*   Basic Metabolic Panel: Recent Labs  Lab 03/21/21 1018 03/23/21 0303 03/24/21 0307  NA 139 134* 135  K 4.3 4.4 4.3  CL 102 101 101  CO2 29 27 26   GLUCOSE 122* 119* 96  BUN 20 20 13   CREATININE 0.48 0.86 0.54  CALCIUM 9.5 8.4* 8.3*  MG  --   --  1.9   GFR: Estimated Creatinine Clearance: 53.4 mL/min (by C-G formula based on SCr of 0.54 mg/dL). Liver Function Tests: No results for input(s): AST, ALT, ALKPHOS, BILITOT, PROT, ALBUMIN in the last 168 hours. No results for input(s): LIPASE, AMYLASE in the last 168 hours. No results for input(s): AMMONIA in the last 168 hours. Coagulation Profile: No results for input(s): INR, PROTIME in the last 168 hours. Cardiac Enzymes: No results for input(s): CKTOTAL, CKMB, CKMBINDEX, TROPONINI in the last 168 hours. BNP (last 3 results) No results for input(s): PROBNP in the last 8760 hours. HbA1C: No results for input(s): HGBA1C in the last 72 hours. CBG: No results for input(s): GLUCAP in the last 168 hours. Lipid Profile: No results for input(s): CHOL, HDL, LDLCALC, TRIG, CHOLHDL, LDLDIRECT in the last 72 hours. Thyroid Function Tests: No results for input(s): TSH,  T4TOTAL, FREET4, T3FREE, THYROIDAB in the last 72 hours. Anemia Panel: No results for input(s): VITAMINB12, FOLATE, FERRITIN, TIBC, IRON, RETICCTPCT in the last 72 hours. Urine analysis:    Component Value Date/Time   COLORURINE YELLOW 10/17/2018 0755   APPEARANCEUR HAZY (A) 10/17/2018 0755   LABSPEC 1.024 10/17/2018 0755   PHURINE 5.0 10/17/2018 0755   GLUCOSEU NEGATIVE 10/17/2018 0755   HGBUR NEGATIVE 10/17/2018 0755   BILIRUBINUR NEGATIVE 10/17/2018 0755   KETONESUR NEGATIVE 10/17/2018 0755   PROTEINUR NEGATIVE 10/17/2018 0755   NITRITE NEGATIVE 10/17/2018 0755   LEUKOCYTESUR LARGE (A) 10/17/2018 0755   Sepsis Labs: @LABRCNTIP (procalcitonin:4,lacticidven:4)  ) Recent Results (from the past 240 hour(s))  Resp Panel by RT-PCR (Flu A&B, Covid) Nasopharyngeal Swab     Status: None   Collection Time: 03/21/21 10:17 AM   Specimen: Nasopharyngeal Swab; Nasopharyngeal(NP) swabs in vial transport medium  Result Value Ref Range Status   SARS Coronavirus 2 by RT PCR NEGATIVE NEGATIVE Final    Comment: (NOTE) SARS-CoV-2 target nucleic acids are NOT DETECTED.  The SARS-CoV-2 RNA is  generally detectable in upper respiratory specimens during the acute phase of infection. The lowest concentration of SARS-CoV-2 viral copies this assay can detect is 138 copies/mL. A negative result does not preclude SARS-Cov-2 infection and should not be used as the sole basis for treatment or other patient management decisions. A negative result may occur with  improper specimen collection/handling, submission of specimen other than nasopharyngeal swab, presence of viral mutation(s) within the areas targeted by this assay, and inadequate number of viral copies(<138 copies/mL). A negative result must be combined with clinical observations, patient history, and epidemiological information. The expected result is Negative.  Fact Sheet for Patients:  BloggerCourse.com  Fact Sheet  for Healthcare Providers:  SeriousBroker.it  This test is no t yet approved or cleared by the [place] FDA and  has been authorized for detection and/or diagnosis of SARS-CoV-2 by FDA under an Emergency Use Authorization (EUA). This EUA will remain  in effect (meaning this test can be used) for the duration of the COVID-19 declaration under Section 564(b)(1) of the Act, 21 U.S.C.section 360bbb-3(b)(1), unless the authorization is terminated  or revoked sooner.       Influenza A by PCR NEGATIVE NEGATIVE Final   Influenza B by PCR NEGATIVE NEGATIVE Final    Comment: (NOTE) The Xpert Xpress SARS-CoV-2/FLU/RSV plus assay is intended as an aid in the diagnosis of influenza from Nasopharyngeal swab specimens and should not be used as a sole basis for treatment. Nasal washings and aspirates are unacceptable for Xpert Xpress SARS-CoV-2/FLU/RSV testing.  Fact Sheet for Patients: BloggerCourse.com  Fact Sheet for Healthcare Providers: SeriousBroker.it  This test is not yet approved or cleared by the [place] FDA and has been authorized for detection and/or diagnosis of SARS-CoV-2 by FDA under an Emergency Use Authorization (EUA). This EUA will remain in effect (meaning this test can be used) for the duration of the COVID-19 declaration under Section 564(b)(1) of the Act, 21 U.S.C. section 360bbb-3(b)(1), unless the authorization is terminated or revoked.  Performed at [facility], [address], [place], [place] [address]          Radiology Studies: No results found.      Scheduled Meds:  divalproex  250 mg Oral Q8H   docusate sodium  100 mg Oral BID   enoxaparin (LOVENOX) injection  40 mg Subcutaneous Q24H   ferrous sulfate  325 mg Oral Q breakfast   haloperidol  1 mg Oral TID   LORazepam  1 mg Intramuscular Once   LORazepam  0.5 mg Oral BID   polyethylene glycol   17 g Oral Daily   senna  1 tablet Oral BID   sertraline  100 mg Oral Daily   tamsulosin  0.4 mg Oral QPC supper   Continuous Infusions:  lactated ringers 75 mL/hr at 03/25/21 1738      LOS: 5 days    Time spent: 15 min    Albertine Grates, MD PhD FACP [facility]   If 7PM-7AM, please contact night-coverage [email or web address] Password [facility] 03/26/2021, 12:31 PM

## 2021-03-27 MED ORDER — ENSURE ENLIVE PO LIQD
237.0000 mL | Freq: Two times a day (BID) | ORAL | Status: DC
  Administered 2021-03-28 – 2021-04-01 (×10): 237 mL via ORAL

## 2021-03-27 MED ORDER — POLYETHYLENE GLYCOL 3350 17 G PO PACK
17.0000 g | PACK | Freq: Two times a day (BID) | ORAL | Status: DC
  Administered 2021-03-27 – 2021-04-01 (×8): 17 g via ORAL
  Filled 2021-03-27 (×9): qty 1

## 2021-03-27 MED ORDER — BISACODYL 10 MG RE SUPP
10.0000 mg | Freq: Every day | RECTAL | Status: AC
  Administered 2021-03-28: 10 mg via RECTAL
  Filled 2021-03-27: qty 1

## 2021-03-27 NOTE — TOC Progression Note (Signed)
Transition of Care [facility]) - Progression Note    Patient Details  Name: Tamara Fisher MRN: [id number] Date of Birth: 05/10/1941  [facility] CM/SW Contact  Amada [place], [place] Phone Number: 03/27/2021, 11:03 AM  Clinical Narrative:    Have left VM for admissions at [facility] to check status of insurance auth - continue to monitor.   Expected Discharge Plan: Skilled Nursing Facility Barriers to Discharge: Continued Medical Work up, English as a second language teacher  Expected Discharge Plan and Services Expected Discharge Plan: Skilled Nursing Facility In-house Referral: Clinical Social Work   Post Acute Care Choice: Skilled Nursing Facility Living arrangements for the past 2 months: Assisted Press photographer (memory care at [employer])                 DME Arranged: N/A DME Agency: NA                   Social Determinants of Health (SDOH) Interventions    Readmission Risk Interventions No flowsheet data found.

## 2021-03-27 NOTE — Progress Notes (Signed)
PROGRESS NOTE    Tamara Fisher  VOZ:[id number] DOB: 1941-05-05 DOA: 03/21/2021 PCP: Medicine, [facility]  Outpatient Specialists: none    Brief Narrative:   Tamara Fisher is a 80 y.o. female with medical history significant for advanced dementia, who presents with the above.   Witnessed mechanical fall at her facility earlier today. Reportedly usual state of health. Is somewhat ambulatory at baseline. Wound to right eye. Not complaining of pain. Does have baseline behavioral disturbance.   Assessment & Plan:   Principal Problem:   Hip fracture [facility]) Active Problems:   Hypothyroidism   Lupus (HCC)   Vascular dementia with behavior disturbance (HCC)   # Acute right femoral neck fracture S/p total hip arthroplasty on 6/21  -Postop pain control, weightbearing status, wound care , postop DVT prophylaxis per Ortho  -Skilled nursing facility placement, awaiting for a bed   # Subdural hematoma Small, seen on CT. EDP discussed w/ neurosurgery who advised monitoring neurologic status, no further intervention if no neurologic changes.   # Laceration R eyebrow, CT face neg for fracture, hemostatic - local wound care, healing    # Hypothyroid - cont home synthroid   # Dementia with behavioral disturbance - cont home depakote, haldol, lorazepam, zoloft - delirium precautions -Speech recommended soft diet, thin liquid -oral intake improved some, ate 50% of her breakfast this am on 6/25, will d/c ivf, monitor oral intake     # Lupus Appears quiescent   # CHF? Saw [facility] cardiology in 2018, given provisional dx of chf based on x-ray findings. TTE and nuclear stress tests ordered, results not available, son says that w/u was unremarkable, no signs fluid overload on exam - monitor -received  hydration due to poor oral intake and dehydration, off ivf on 6/25 Monitor volume status   FTT: snf placement and likely will benefit from palliative care following  at snf  Post op urinary retention  started on Flomax, increase activity, prevent constipation Foley removed on 6/26 am, voided in pm,  will continue monitor with bladder scan   DVT prophylaxis: lovenox Code Status: dnr Family Communication: None at bedside  Level of care: Med-Surg Status is: Inpatient  Remains inpatient appropriate because:Inpatient level of care appropriate due to severity of illness  Dispo: The patient is from: SNF              Anticipated d/c is to: SNF with palliative care              Patient currently medically stable to discharge to snf, awaiting for insurance approval for snf, covid screening ordered on 6/26 pm   Difficult to place patient No    Consultants:  Orthopedic surgery  Procedures: Total hip arthroplasty right  Antimicrobials:  Ancef perioperative    Subjective: PoD#5 On interval changes, demented elderly, does not appear in distress Foley removed  No bm for two days   She is stable medically, awaiting for snf bed   Objective: Vitals:   03/26/21 2234 03/27/21 0642 03/27/21 1017 03/27/21 1400  BP: 108/71 93/62 106/66 114/65  Pulse: 82 70 89 71  Resp: 20 16  16   Temp: 98.4 F (36.9 C) 97.8 F (36.6 C)  98.6 F (37 C)  TempSrc: Oral Oral  Oral  SpO2: 91% 91%  94%  Weight:      Height:        Intake/Output Summary (Last 24 hours) at 03/27/2021 1501 Last data filed at 03/27/2021 1358 Gross per 24 hour  Intake 348.79 ml  Output 750 ml  Net -401.21 ml   Filed Weights   03/21/21 0843  Weight: 68 kg    Examination:  Constitutional: No acute distress, demented, currently no agitation Head: bruise right eye with laceration that is glued closed, now healing  Eyes: Conjunctiva clear ENM: Moist mucous membranes.   Neck: Supple Respiratory: Clear to auscultation bilaterally, no wheezing/rales/rhonchi. Normal respiratory effort. No accessory muscle use. . Cardiovascular: Regular rate and rhythm. Soft systolic  murmur Abdomen: Non-tender, non-distended. No masses. No rebound or guarding. Positive bowel sounds. Musculoskeletal: No joint deformity upper and lower extremities. Normal ROM, no contractures. Normal muscle tone. Skin: No rashes, lesions, or ulcers. Sacrum examined Extremities: right hip post op changes, neurovascular intact distally  Neurologic: awake, demented, no agitation, more interactive Psychiatric: calm and cooperative   Data Reviewed: I have personally reviewed following labs and imaging studies  CBC: Recent Labs  Lab 03/21/21 1018 03/23/21 0303 03/24/21 0307 03/25/21 0305  WBC 10.1 11.0* 8.4 7.2  NEUTROABS 8.8*  --  6.2  --   HGB 14.0 10.4* 10.1* 9.3*  HCT 43.1 31.8* 33.3* 28.2*  MCV 94.9 94.4 100.3* 94.0  PLT 157 131* 107* 146*   Basic Metabolic Panel: Recent Labs  Lab 03/21/21 1018 03/23/21 0303 03/24/21 0307  NA 139 134* 135  K 4.3 4.4 4.3  CL 102 101 101  CO2 29 27 26   GLUCOSE 122* 119* 96  BUN 20 20 13   CREATININE 0.48 0.86 0.54  CALCIUM 9.5 8.4* 8.3*  MG  --   --  1.9   GFR: Estimated Creatinine Clearance: 53.4 mL/min (by C-G formula based on SCr of 0.54 mg/dL). Liver Function Tests: No results for input(s): AST, ALT, ALKPHOS, BILITOT, PROT, ALBUMIN in the last 168 hours. No results for input(s): LIPASE, AMYLASE in the last 168 hours. No results for input(s): AMMONIA in the last 168 hours. Coagulation Profile: No results for input(s): INR, PROTIME in the last 168 hours. Cardiac Enzymes: No results for input(s): CKTOTAL, CKMB, CKMBINDEX, TROPONINI in the last 168 hours. BNP (last 3 results) No results for input(s): PROBNP in the last 8760 hours. HbA1C: No results for input(s): HGBA1C in the last 72 hours. CBG: No results for input(s): GLUCAP in the last 168 hours. Lipid Profile: No results for input(s): CHOL, HDL, LDLCALC, TRIG, CHOLHDL, LDLDIRECT in the last 72 hours. Thyroid Function Tests: No results for input(s): TSH, T4TOTAL, FREET4,  T3FREE, THYROIDAB in the last 72 hours. Anemia Panel: No results for input(s): VITAMINB12, FOLATE, FERRITIN, TIBC, IRON, RETICCTPCT in the last 72 hours. Urine analysis:    Component Value Date/Time   COLORURINE YELLOW 10/17/2018 0755   APPEARANCEUR HAZY (A) 10/17/2018 0755   LABSPEC 1.024 10/17/2018 0755   PHURINE 5.0 10/17/2018 0755   GLUCOSEU NEGATIVE 10/17/2018 0755   HGBUR NEGATIVE 10/17/2018 0755   BILIRUBINUR NEGATIVE 10/17/2018 0755   KETONESUR NEGATIVE 10/17/2018 0755   PROTEINUR NEGATIVE 10/17/2018 0755   NITRITE NEGATIVE 10/17/2018 0755   LEUKOCYTESUR LARGE (A) 10/17/2018 0755   Sepsis Labs: @LABRCNTIP (procalcitonin:4,lacticidven:4)  ) Recent Results (from the past 240 hour(s))  Resp Panel by RT-PCR (Flu A&B, Covid) Nasopharyngeal Swab     Status: None   Collection Time: 03/21/21 10:17 AM   Specimen: Nasopharyngeal Swab; Nasopharyngeal(NP) swabs in vial transport medium  Result Value Ref Range Status   SARS Coronavirus 2 by RT PCR NEGATIVE NEGATIVE Final    Comment: (NOTE) SARS-CoV-2 target nucleic acids are NOT DETECTED.  The SARS-CoV-2 RNA  is generally detectable in upper respiratory specimens during the acute phase of infection. The lowest concentration of SARS-CoV-2 viral copies this assay can detect is 138 copies/mL. A negative result does not preclude SARS-Cov-2 infection and should not be used as the sole basis for treatment or other patient management decisions. A negative result may occur with  improper specimen collection/handling, submission of specimen other than nasopharyngeal swab, presence of viral mutation(s) within the areas targeted by this assay, and inadequate number of viral copies(<138 copies/mL). A negative result must be combined with clinical observations, patient history, and epidemiological information. The expected result is Negative.  Fact Sheet for Patients:  BloggerCourse.com  Fact Sheet for Healthcare  Providers:  SeriousBroker.it  This test is no t yet approved or cleared by the [place] FDA and  has been authorized for detection and/or diagnosis of SARS-CoV-2 by FDA under an Emergency Use Authorization (EUA). This EUA will remain  in effect (meaning this test can be used) for the duration of the COVID-19 declaration under Section 564(b)(1) of the Act, 21 U.S.C.section 360bbb-3(b)(1), unless the authorization is terminated  or revoked sooner.       Influenza A by PCR NEGATIVE NEGATIVE Final   Influenza B by PCR NEGATIVE NEGATIVE Final    Comment: (NOTE) The Xpert Xpress SARS-CoV-2/FLU/RSV plus assay is intended as an aid in the diagnosis of influenza from Nasopharyngeal swab specimens and should not be used as a sole basis for treatment. Nasal washings and aspirates are unacceptable for Xpert Xpress SARS-CoV-2/FLU/RSV testing.  Fact Sheet for Patients: BloggerCourse.com  Fact Sheet for Healthcare Providers: SeriousBroker.it  This test is not yet approved or cleared by the [place] FDA and has been authorized for detection and/or diagnosis of SARS-CoV-2 by FDA under an Emergency Use Authorization (EUA). This EUA will remain in effect (meaning this test can be used) for the duration of the COVID-19 declaration under Section 564(b)(1) of the Act, 21 U.S.C. section 360bbb-3(b)(1), unless the authorization is terminated or revoked.  Performed at [facility], [address], [place], [place] [address]          Radiology Studies: No results found.      Scheduled Meds:  divalproex  250 mg Oral Q8H   docusate sodium  100 mg Oral BID   enoxaparin (LOVENOX) injection  40 mg Subcutaneous Q24H   ferrous sulfate  325 mg Oral Q breakfast   haloperidol  1 mg Oral TID   LORazepam  1 mg Intramuscular Once   LORazepam  0.5 mg Oral BID   polyethylene glycol  17 g Oral  Daily   senna  1 tablet Oral BID   sertraline  100 mg Oral Daily   tamsulosin  0.4 mg Oral QPC supper   Continuous Infusions:      LOS: 6 days    Time spent: 15 min    Albertine Grates, MD PhD FACP [facility]   If 7PM-7AM, please contact night-coverage [email or web address] Password [facility] 03/27/2021, 3:01 PM

## 2021-03-27 NOTE — Plan of Care (Signed)
°  Problem: Coping: °Goal: Level of anxiety will decrease °Outcome: Progressing °  °

## 2021-03-28 DIAGNOSIS — S72001A Fracture of unspecified part of neck of right femur, initial encounter for closed fracture: Principal | ICD-10-CM

## 2021-03-28 LAB — SARS CORONAVIRUS 2 (TAT 6-24 HRS): SARS Coronavirus 2: NEGATIVE

## 2021-03-28 NOTE — Progress Notes (Signed)
Physical Therapy Treatment Patient Details Name: Tamara Fisher MRN: [id number] DOB: 11/26/1940 Today's Date: 03/28/2021    History of Present Illness 80 yo female admitted6/20/22 after a fall at  [facility], sustained dispaced  right femoral neck fracture, S/P Posterior hemiarthroplasy on 03/22/21. WIO:[id number]    PT Comments    Patient with improved mentation today and able to follow simple commands with verbal/tactile cues to initiate transfers and limited gait. Patient completed 3x sit<>stand from EOB with Max +2 assist and was able to take several small steps EOB. EOS pt returned to bed, Total assist, and repositioned to maintain posterior hip precautions. Acute PT will continue to follow and progress pt as able.    Follow Up Recommendations  SNF     Equipment Recommendations  None recommended by PT    Recommendations for Other Services       Precautions / Restrictions Precautions Precautions: Posterior Hip;Fall Precaution Booklet Issued: No Precaution Comments: patient with advanced dementia, not following precautions at bed level Restrictions Weight Bearing Restrictions: No RLE Weight Bearing: Weight bearing as tolerated    Mobility  Bed Mobility Overal bed mobility: Needs Assistance Bed Mobility: Supine to Sit;Sit to Supine     Supine to sit: Max assist;HOB elevated Sit to supine: Total assist;+2 for physical assistance;+2 for safety/equipment   General bed mobility comments: pt required Max cuing to reach UE for assist to raise trunk. pt initiated walking LE's off EOB and bed pad required to pivot hips and maintain posterior hip precautions. She required Max assist to raise trunk fully to sit EOB and was able to stabilize balance with Bil UE support. Total/max Assist with 2+ for return to supine at EOS and repositioning in bed with pillows.    Transfers Overall transfer level: Needs assistance Equipment used: Rolling walker (2 wheeled);2 person hand held  assist Transfers: Sit to/from Stand Sit to Stand: Max assist;+2 physical assistance;+2 safety/equipment         General transfer comment: Multimodal cues for hand placement on RW and Max assist to complete rise. Pt initiated with count down but unable to fully achieve power up. Total of 3 stands from EOB for NT to complete pericare and bed change.  Ambulation/Gait Ambulation/Gait assistance: Max assist;+2 safety/equipment Gait Distance (Feet): 3 Feet Assistive device: Rolling walker (2 wheeled) Gait Pattern/deviations: Step-to pattern;Decreased stride length;Decreased weight shift to right;Trunk flexed;Narrow base of support     General Gait Details: pt took several side steps at EOB with 2+ max assist for safety and to manage walker for step pattern. pt easily fatigued and returned to sit EOB.   Stairs             Wheelchair Mobility    Modified Rankin (Stroke Patients Only)       Balance Overall balance assessment: History of Falls;Needs assistance Sitting-balance support: Feet supported;Bilateral upper extremity supported Sitting balance-Leahy Scale: Poor Sitting balance - Comments: initial posterior lean but with increased time progressed to balancing at sup level Postural control: Posterior lean Standing balance support: During functional activity;Bilateral upper extremity supported Standing balance-Leahy Scale: Zero Standing balance comment: +2 assist to attain and maintain standing upright                            Cognition Arousal/Alertness: Awake/alert Behavior During Therapy: Flat affect Overall Cognitive Status: History of cognitive impairments - at baseline  General Comments: history of advanced dementia; pt with improved mentation today and following commands with verbal/tactile cues for transfers and stepping EOB.      Exercises      General Comments        Pertinent Vitals/Pain Pain  Assessment: Faces Faces Pain Scale: No hurt Pain Intervention(s): Monitored during session;Premedicated before session;Repositioned    Home Living                      Prior Function            PT Goals (current goals can now be found in the care plan section) Acute Rehab PT Goals Patient Stated Goal: patient unable PT Goal Formulation: Patient unable to participate in goal setting Time For Goal Achievement: 04/06/21 Potential to Achieve Goals: Fair Progress towards PT goals: Progressing toward goals    Frequency    Min 2X/week      PT Plan Current plan remains appropriate    Co-evaluation              AM-PAC PT "6 Clicks" Mobility   Outcome Measure  Help needed turning from your back to your side while in a flat bed without using bedrails?: Total Help needed moving from lying on your back to sitting on the side of a flat bed without using bedrails?: Total Help needed moving to and from a bed to a chair (including a wheelchair)?: Total Help needed standing up from a chair using your arms (e.g., wheelchair or bedside chair)?: Total Help needed to walk in hospital room?: Total Help needed climbing 3-5 steps with a railing? : Total 6 Click Score: 6    End of Session Equipment Utilized During Treatment: Gait belt Activity Tolerance: Patient tolerated treatment well Patient left: in bed;with call bell/phone within reach;with bed alarm set Nurse Communication: Mobility status PT Visit Diagnosis: Unsteadiness on feet (R26.81);Muscle weakness (generalized) (M62.81)     Time: 3220-2542 PT Time Calculation (min) (ACUTE ONLY): 24 min  Charges:  $Therapeutic Activity: 23-37 mins                     Wynn Maudlin, DPT Acute Rehabilitation Services Office [phone] Pager [phone]    Anitra Lauth 03/28/2021, 1:39 PM

## 2021-03-28 NOTE — Care Management Important Message (Signed)
Important Message  Patient Details IM Letter placed in Patient's room. Name: Tamara Fisher MRN: [id number] Date of Birth: 11-14-1940   Medicare Important Message Given:  Yes     Caren Macadam 03/28/2021, 3:18 PM

## 2021-03-28 NOTE — Plan of Care (Signed)
  Problem: Nutrition: Goal: Adequate nutrition will be maintained Outcome: Progressing   Problem: Elimination: Goal: Will not experience complications related to urinary retention Outcome: Progressing   Problem: Activity: Goal: Risk for activity intolerance will decrease Outcome: Progressing   Problem: Safety: Goal: Ability to remain free from injury will improve Outcome: Progressing   Problem: Pain Managment: Goal: General experience of comfort will improve Outcome: Progressing

## 2021-03-28 NOTE — Progress Notes (Signed)
Patient took off her surgical dressing. Incision site dry clean, intact and no drainage. Replaced with new aquacel dressing to site.

## 2021-03-28 NOTE — Progress Notes (Signed)
PROGRESS NOTE    Tamara Fisher  ZOX:[id number] DOB: 03-Aug-1941 DOA: 03/21/2021 PCP: Medicine, [facility]   Chief Complain: Fall  Brief Narrative: Patient is a 80 year old female with history of advanced dementia who presented from her ALF/memory care with witnessed mechanical fall.  She was found to have acute right humeral neck fracture and underwent total hip arthroplasty on 6/21.  She is waiting for bed at SNF.  Social worker following.  She is medically stable for discharge to skilled nursing facility as soon as bed is available.  Assessment & Plan:   Principal Problem:   Hip fracture (HCC) Active Problems:   Hypothyroidism   Lupus (HCC)   Vascular dementia with behavior disturbance (HCC)   Acute right femoral neck fracture: Presented with mechanical fall.  Status post total hip arthroplasty on 6/21.  Continue pain management, supportive care, weightbearing as tolerated.  She is to follow-up with orthopedics as an outpatient.  Subdural hematoma: Small size, seen on CT.  On admission, case was discussed with neurosurgery who advised monitoring. no further intervention.  She does not have any new neurological changes.  Right eyebrow laceration: CT face was negative for fracture.  Local wound care.  Healing well.  Hypothyroidism: Continue Synthyroid  History of advanced dementia: Continue home Depakote, Haldol, lorazepam, Zoloft.  Monitor mental status.  Delirium precautions.  Speech therapy recommended soft diet, thin liquid.  History of lupus: Currently stable  History of congestive heart failure: Currently euvolemic  Postop urinary retention: Status post Foley removal on 6/26.  Now voiding.  Started on Flomax.  Debility/deconditioning: PT/OT recommended skilled nursing facility on  discharge.  She was previously living in ALF/memory care         DVT prophylaxis:Lovenox Code Status: DNR Family Communication:  Status is: Inpatient  Remains  inpatient appropriate because:Unsafe d/c plan  Dispo: The patient is from: SNF              Anticipated d/c is to: SNF              Patient currently is medically stable to d/c.   Difficult to place patient No     Consultants: Orthopedics  Procedures:ORIF  Antimicrobials:  Anti-infectives (From admission, onward)    Start     Dose/Rate Route Frequency Ordered Stop   03/22/21 1700  ceFAZolin (ANCEF) IVPB 2g/100 mL premix        2 g 200 mL/hr over 30 Minutes Intravenous Every 6 hours 03/22/21 1418 03/22/21 2333   03/21/21 1415  ceFAZolin (ANCEF) IVPB 2g/100 mL premix  Status:  Discontinued        2 g 200 mL/hr over 30 Minutes Intravenous On call to O.R. 03/21/21 1406 03/22/21 0559       Subjective:  Patient seen and examined the bedside this afternoon.  Hemodynamically stable.  Sleeping but woke up when when I entered the room.  Looks comfortable   Objective: Vitals:   03/27/21 1400 03/27/21 2144 03/28/21 0416 03/28/21 1343  BP: 114/65 100/68 (!) 102/57 107/63  Pulse: 71 80 80 75  Resp: 16 14 16 16   Temp: 98.6 F (37 C) 98.7 F (37.1 C) 98 F (36.7 C) 97.6 F (36.4 C)  TempSrc: Oral Oral Oral   SpO2: 94% 93% 90% 96%  Weight:      Height:        Intake/Output Summary (Last 24 hours) at 03/28/2021 1405 Last data filed at 03/28/2021 1230 Gross per 24 hour  Intake 870 ml  Output --  Net 870 ml   Filed Weights   03/21/21 0843  Weight: 68 kg    Examination:  General exam: Appears calm and comfortable ,Not in distress, deconditioned elderly female, pleasantly confused  HEENT: Right supraorbital bruise Respiratory system: Bilateral equal air entry, normal vesicular breath sounds, no wheezes or crackles  Cardiovascular system: S1 & S2 heard, RRR. No JVD, murmurs, rubs, gallops or clicks. No pedal edema. Gastrointestinal system: Abdomen is nondistended, soft and nontender. No organomegaly or masses felt. Normal bowel sounds heard. Central nervous system: Alert  and awake but not oriented Extremities: No edema, no clubbing ,no cyanosis Skin: No  ulcers,no icterus ,no pallor     Data Reviewed: I have personally reviewed following labs and imaging studies  CBC: Recent Labs  Lab 03/23/21 0303 03/24/21 0307 03/25/21 0305  WBC 11.0* 8.4 7.2  NEUTROABS  --  6.2  --   HGB 10.4* 10.1* 9.3*  HCT 31.8* 33.3* 28.2*  MCV 94.4 100.3* 94.0  PLT 131* 107* 146*   Basic Metabolic Panel: Recent Labs  Lab 03/23/21 0303 03/24/21 0307  NA 134* 135  K 4.4 4.3  CL 101 101  CO2 27 26  GLUCOSE 119* 96  BUN 20 13  CREATININE 0.86 0.54  CALCIUM 8.4* 8.3*  MG  --  1.9   GFR: Estimated Creatinine Clearance: 53.4 mL/min (by C-G formula based on SCr of 0.54 mg/dL). Liver Function Tests: No results for input(s): AST, ALT, ALKPHOS, BILITOT, PROT, ALBUMIN in the last 168 hours. No results for input(s): LIPASE, AMYLASE in the last 168 hours. No results for input(s): AMMONIA in the last 168 hours. Coagulation Profile: No results for input(s): INR, PROTIME in the last 168 hours. Cardiac Enzymes: No results for input(s): CKTOTAL, CKMB, CKMBINDEX, TROPONINI in the last 168 hours. BNP (last 3 results) No results for input(s): PROBNP in the last 8760 hours. HbA1C: No results for input(s): HGBA1C in the last 72 hours. CBG: No results for input(s): GLUCAP in the last 168 hours. Lipid Profile: No results for input(s): CHOL, HDL, LDLCALC, TRIG, CHOLHDL, LDLDIRECT in the last 72 hours. Thyroid Function Tests: No results for input(s): TSH, T4TOTAL, FREET4, T3FREE, THYROIDAB in the last 72 hours. Anemia Panel: No results for input(s): VITAMINB12, FOLATE, FERRITIN, TIBC, IRON, RETICCTPCT in the last 72 hours. Sepsis Labs: No results for input(s): PROCALCITON, LATICACIDVEN in the last 168 hours.  Recent Results (from the past 240 hour(s))  Resp Panel by RT-PCR (Flu A&B, Covid) Nasopharyngeal Swab     Status: None   Collection Time: 03/21/21 10:17 AM    Specimen: Nasopharyngeal Swab; Nasopharyngeal(NP) swabs in vial transport medium  Result Value Ref Range Status   SARS Coronavirus 2 by RT PCR NEGATIVE NEGATIVE Final    Comment: (NOTE) SARS-CoV-2 target nucleic acids are NOT DETECTED.  The SARS-CoV-2 RNA is generally detectable in upper respiratory specimens during the acute phase of infection. The lowest concentration of SARS-CoV-2 viral copies this assay can detect is 138 copies/mL. A negative result does not preclude SARS-Cov-2 infection and should not be used as the sole basis for treatment or other patient management decisions. A negative result may occur with  improper specimen collection/handling, submission of specimen other than nasopharyngeal swab, presence of viral mutation(s) within the areas targeted by this assay, and inadequate number of viral copies(<138 copies/mL). A negative result must be combined with clinical observations, patient history, and epidemiological information. The expected result is Negative.  Fact Sheet for Patients:  BloggerCourse.com  Fact Sheet for Healthcare Providers:  SeriousBroker.it  This test is no t yet approved or cleared by the [place] FDA and  has been authorized for detection and/or diagnosis of SARS-CoV-2 by FDA under an Emergency Use Authorization (EUA). This EUA will remain  in effect (meaning this test can be used) for the duration of the COVID-19 declaration under Section 564(b)(1) of the Act, 21 U.S.C.section 360bbb-3(b)(1), unless the authorization is terminated  or revoked sooner.       Influenza A by PCR NEGATIVE NEGATIVE Final   Influenza B by PCR NEGATIVE NEGATIVE Final    Comment: (NOTE) The Xpert Xpress SARS-CoV-2/FLU/RSV plus assay is intended as an aid in the diagnosis of influenza from Nasopharyngeal swab specimens and should not be used as a sole basis for treatment. Nasal washings and aspirates are  unacceptable for Xpert Xpress SARS-CoV-2/FLU/RSV testing.  Fact Sheet for Patients: BloggerCourse.com  Fact Sheet for Healthcare Providers: SeriousBroker.it  This test is not yet approved or cleared by the [place] FDA and has been authorized for detection and/or diagnosis of SARS-CoV-2 by FDA under an Emergency Use Authorization (EUA). This EUA will remain in effect (meaning this test can be used) for the duration of the COVID-19 declaration under Section 564(b)(1) of the Act, 21 U.S.C. section 360bbb-3(b)(1), unless the authorization is terminated or revoked.  Performed at [facility], [address] [place], [place] [address]   SARS CORONAVIRUS 2 (TAT 6-24 HRS) Nasopharyngeal Nasopharyngeal Swab     Status: None   Collection Time: 03/27/21  3:01 PM   Specimen: Nasopharyngeal Swab  Result Value Ref Range Status   SARS Coronavirus 2 NEGATIVE NEGATIVE Final    Comment: (NOTE) SARS-CoV-2 target nucleic acids are NOT DETECTED.  The SARS-CoV-2 RNA is generally detectable in upper and lower respiratory specimens during the acute phase of infection. Negative results do not preclude SARS-CoV-2 infection, do not rule out co-infections with other pathogens, and should not be used as the sole basis for treatment or other patient management decisions. Negative results must be combined with clinical observations, patient history, and epidemiological information. The expected result is Negative.  Fact Sheet for Patients: HairSlick.no  Fact Sheet for Healthcare Providers: quierodirigir.com  This test is not yet approved or cleared by the [place] FDA and  has been authorized for detection and/or diagnosis of SARS-CoV-2 by FDA under an Emergency Use Authorization (EUA). This EUA will remain  in effect (meaning this test can be used) for the duration of  the COVID-19 declaration under Se ction 564(b)(1) of the Act, 21 U.S.C. section 360bbb-3(b)(1), unless the authorization is terminated or revoked sooner.  Performed at [facility], [address], [place], [place] [address]          Radiology Studies: No results found.      Scheduled Meds:  bisacodyl  10 mg Rectal Daily   divalproex  250 mg Oral Q8H   docusate sodium  100 mg Oral BID   enoxaparin (LOVENOX) injection  40 mg Subcutaneous Q24H   feeding supplement  237 mL Oral BID BM   ferrous sulfate  325 mg Oral Q breakfast   haloperidol  1 mg Oral TID   LORazepam  1 mg Intramuscular Once   LORazepam  0.5 mg Oral BID   polyethylene glycol  17 g Oral BID   senna  1 tablet Oral BID   sertraline  100 mg Oral Daily   tamsulosin  0.4 mg Oral QPC  supper   Continuous Infusions:   LOS: 7 days    Time spent: 25 mins,More than 50% of that time was spent in counseling and/or coordination of care.      Burnadette Pop, MD [facility] P6/27/2022, 2:05 PM

## 2021-03-29 LAB — CREATININE, SERUM
Creatinine, Ser: 0.63 mg/dL (ref 0.44–1.00)
GFR, Estimated: >60 mL/min (ref >60)

## 2021-03-29 MED ORDER — LORAZEPAM 0.5 MG PO TABS: 0.5000 mg | ORAL_TABLET | Freq: Two times a day (BID) | ORAL | 0 refills | Status: DC

## 2021-03-29 MED ORDER — LORAZEPAM 0.5 MG PO TABS: 0.5000 mg | ORAL_TABLET | Freq: Two times a day (BID) | ORAL | 0 refills | Status: AC

## 2021-03-29 NOTE — Progress Notes (Signed)
PROGRESS NOTE    Imagine Nest  YHC:[id number] DOB: [birth year] DOA: 03/21/2021 PCP: Medicine, [facility]   Chief Complain: Fall  Brief Narrative: Patient is a 80 year old female with history of advanced dementia who presented from her ALF/memory care with witnessed mechanical fall.  She was found to have acute right humeral neck fracture and underwent total hip arthroplasty on 6/21.  She is waiting for bed at SNF.  Social worker following.  She is medically stable for discharge to skilled nursing facility as soon as bed is available.  Assessment & Plan:   Principal Problem:   Hip fracture (HCC) Active Problems:   Hypothyroidism   Lupus (HCC)   Vascular dementia with behavior disturbance (HCC)   Acute right femoral neck fracture: Presented with mechanical fall.  Status post total hip arthroplasty on 6/21.  Continue pain management, supportive care, weightbearing as tolerated.  She needs to follow-up with orthopedics as an outpatient.  Subdural hematoma: Small size, seen on CT.  On admission, case was discussed with neurosurgery who advised monitoring. no further intervention.  She does not have any new neurological changes.  Right eyebrow laceration: CT face was negative for fracture.  Local wound care.  Healing well.  Hypothyroidism: Continue Synthyroid  History of advanced dementia: Continue home Depakote, Haldol, lorazepam, Zoloft.  Monitor mental status.  Delirium precautions.  Speech therapy recommended soft diet, thin liquid.  History of lupus: Currently stable  History of congestive heart failure: Currently euvolemic  Postop urinary retention: Status post Foley removal on 6/26.  Now voiding.  Started on Flomax.  Debility/deconditioning: PT/OT recommended skilled nursing facility on  discharge.  She was previously living in ALF/memory care         DVT prophylaxis:Lovenox Code Status: DNR Family Communication: None at bedside Status is:  Inpatient  Remains inpatient appropriate because:Unsafe d/c plan  Dispo: The patient is from: SNF              Anticipated d/c is to: SNF              Patient currently is medically stable to d/c.   Difficult to place patient No     Consultants: Orthopedics  Procedures:ORIF  Antimicrobials:  Anti-infectives (From admission, onward)    Start     Dose/Rate Route Frequency Ordered Stop   03/22/21 1700  ceFAZolin (ANCEF) IVPB 2g/100 mL premix        2 g 200 mL/hr over 30 Minutes Intravenous Every 6 hours 03/22/21 1418 03/22/21 2333   03/21/21 1415  ceFAZolin (ANCEF) IVPB 2g/100 mL premix  Status:  Discontinued        2 g 200 mL/hr over 30 Minutes Intravenous On call to O.R. 03/21/21 1406 03/22/21 0559       Subjective:  Patient seen and examined at the bedside this morning.  Hemodynamically stable.  Appears comfortable without any distress.  Objective: Vitals:   03/28/21 1343 03/28/21 2222 03/29/21 0612 03/29/21 1308  BP: 107/63 103/70 105/69 139/82  Pulse: 75 72 69 80  Resp: 16 18 17 16   Temp: 97.6 F (36.4 C) 98 F (36.7 C) 97.9 F (36.6 C) 97.6 F (36.4 C)  TempSrc:  Oral Oral Oral  SpO2: 96% 97% 95% 96%  Weight:      Height:        Intake/Output Summary (Last 24 hours) at 03/29/2021 1421 Last data filed at 03/29/2021 1153 Gross per 24 hour  Intake 647 ml  Output --  Net 647 ml  Filed Weights   03/21/21 0843  Weight: 68 kg    Examination:  General exam: Overall comfortable, not in distress, elderly debilitated female, confused HEENT: Right supraorbital bruise Respiratory system:  no wheezes or crackles  Cardiovascular system: S1 & S2 heard, RRR.  Gastrointestinal system: Abdomen is nondistended, soft and nontender. Central nervous system: Alert and awake but not oriented Extremities: No edema, no clubbing ,no cyanosis Skin: No rashes, no ulcers,no icterus     Data Reviewed: I have personally reviewed following labs and imaging  studies  CBC: Recent Labs  Lab 03/23/21 0303 03/24/21 0307 03/25/21 0305  WBC 11.0* 8.4 7.2  NEUTROABS  --  6.2  --   HGB 10.4* 10.1* 9.3*  HCT 31.8* 33.3* 28.2*  MCV 94.4 100.3* 94.0  PLT 131* 107* 146*   Basic Metabolic Panel: Recent Labs  Lab 03/23/21 0303 03/24/21 0307 03/29/21 0249  NA 134* 135  --   K 4.4 4.3  --   CL 101 101  --   CO2 27 26  --   GLUCOSE 119* 96  --   BUN 20 13  --   CREATININE 0.86 0.54 0.63  CALCIUM 8.4* 8.3*  --   MG  --  1.9  --    GFR: Estimated Creatinine Clearance: 53.4 mL/min (by C-G formula based on SCr of 0.63 mg/dL). Liver Function Tests: No results for input(s): AST, ALT, ALKPHOS, BILITOT, PROT, ALBUMIN in the last 168 hours. No results for input(s): LIPASE, AMYLASE in the last 168 hours. No results for input(s): AMMONIA in the last 168 hours. Coagulation Profile: No results for input(s): INR, PROTIME in the last 168 hours. Cardiac Enzymes: No results for input(s): CKTOTAL, CKMB, CKMBINDEX, TROPONINI in the last 168 hours. BNP (last 3 results) No results for input(s): PROBNP in the last 8760 hours. HbA1C: No results for input(s): HGBA1C in the last 72 hours. CBG: No results for input(s): GLUCAP in the last 168 hours. Lipid Profile: No results for input(s): CHOL, HDL, LDLCALC, TRIG, CHOLHDL, LDLDIRECT in the last 72 hours. Thyroid Function Tests: No results for input(s): TSH, T4TOTAL, FREET4, T3FREE, THYROIDAB in the last 72 hours. Anemia Panel: No results for input(s): VITAMINB12, FOLATE, FERRITIN, TIBC, IRON, RETICCTPCT in the last 72 hours. Sepsis Labs: No results for input(s): PROCALCITON, LATICACIDVEN in the last 168 hours.  Recent Results (from the past 240 hour(s))  Resp Panel by RT-PCR (Flu A&B, Covid) Nasopharyngeal Swab     Status: None   Collection Time: 03/21/21 10:17 AM   Specimen: Nasopharyngeal Swab; Nasopharyngeal(NP) swabs in vial transport medium  Result Value Ref Range Status   SARS Coronavirus 2 by RT  PCR NEGATIVE NEGATIVE Final    Comment: (NOTE) SARS-CoV-2 target nucleic acids are NOT DETECTED.  The SARS-CoV-2 RNA is generally detectable in upper respiratory specimens during the acute phase of infection. The lowest concentration of SARS-CoV-2 viral copies this assay can detect is 138 copies/mL. A negative result does not preclude SARS-Cov-2 infection and should not be used as the sole basis for treatment or other patient management decisions. A negative result may occur with  improper specimen collection/handling, submission of specimen other than nasopharyngeal swab, presence of viral mutation(s) within the areas targeted by this assay, and inadequate number of viral copies(<138 copies/mL). A negative result must be combined with clinical observations, patient history, and epidemiological information. The expected result is Negative.  Fact Sheet for Patients:  BloggerCourse.comhttps://www.fda.gov/media/152166/download  Fact Sheet for Healthcare Providers:  SeriousBroker.ithttps://www.fda.gov/media/152162/download  This test is  no t yet approved or cleared by the [place] and  has been authorized for detection and/or diagnosis of SARS-CoV-2 by FDA under an Emergency Use Authorization (EUA). This EUA will remain  in effect (meaning this test can be used) for the duration of the COVID-19 declaration under Section 564(b)(1) of the Act, 21 U.S.C.section 360bbb-3(b)(1), unless the authorization is terminated  or revoked sooner.       Influenza A by PCR NEGATIVE NEGATIVE Final   Influenza B by PCR NEGATIVE NEGATIVE Final    Comment: (NOTE) The Xpert Xpress SARS-CoV-2/FLU/RSV plus assay is intended as an aid in the diagnosis of influenza from Nasopharyngeal swab specimens and should not be used as a sole basis for treatment. Nasal washings and aspirates are unacceptable for Xpert Xpress SARS-CoV-2/FLU/RSV testing.  Fact Sheet for Patients: BloggerCourse.com  Fact Sheet for  Healthcare Providers: SeriousBroker.it  This test is not yet approved or cleared by the [place] FDA and has been authorized for detection and/or diagnosis of SARS-CoV-2 by FDA under an Emergency Use Authorization (EUA). This EUA will remain in effect (meaning this test can be used) for the duration of the COVID-19 declaration under Section 564(b)(1) of the Act, 21 U.S.C. section 360bbb-3(b)(1), unless the authorization is terminated or revoked.  Performed at [facility], [address], [place], [place] [address]   SARS CORONAVIRUS 2 (TAT 6-24 HRS) Nasopharyngeal Nasopharyngeal Swab     Status: None   Collection Time: 03/27/21  3:01 PM   Specimen: Nasopharyngeal Swab  Result Value Ref Range Status   SARS Coronavirus 2 NEGATIVE NEGATIVE Final    Comment: (NOTE) SARS-CoV-2 target nucleic acids are NOT DETECTED.  The SARS-CoV-2 RNA is generally detectable in upper and lower respiratory specimens during the acute phase of infection. Negative results do not preclude SARS-CoV-2 infection, do not rule out co-infections with other pathogens, and should not be used as the sole basis for treatment or other patient management decisions. Negative results must be combined with clinical observations, patient history, and epidemiological information. The expected result is Negative.  Fact Sheet for Patients: HairSlick.no  Fact Sheet for Healthcare Providers: quierodirigir.com  This test is not yet approved or cleared by the [place] FDA and  has been authorized for detection and/or diagnosis of SARS-CoV-2 by FDA under an Emergency Use Authorization (EUA). This EUA will remain  in effect (meaning this test can be used) for the duration of the COVID-19 declaration under Se ction 564(b)(1) of the Act, 21 U.S.C. section 360bbb-3(b)(1), unless the authorization is terminated or revoked  sooner.  Performed at [facility], [address], [place], [place] [address]          Radiology Studies: No results found.      Scheduled Meds:  divalproex  250 mg Oral Q8H   docusate sodium  100 mg Oral BID   enoxaparin (LOVENOX) injection  40 mg Subcutaneous Q24H   feeding supplement  237 mL Oral BID BM   ferrous sulfate  325 mg Oral Q breakfast   haloperidol  1 mg Oral TID   LORazepam  1 mg Intramuscular Once   LORazepam  0.5 mg Oral BID   polyethylene glycol  17 g Oral BID   senna  1 tablet Oral BID   sertraline  100 mg Oral Daily   tamsulosin  0.4 mg Oral QPC supper   Continuous Infusions:   LOS: 8 days    Time spent: 15 mins,More than 50%  of that time was spent in counseling and/or coordination of care.      Burnadette Pop, MD [facility] P6/28/2022, 2:21 PM

## 2021-03-29 NOTE — Plan of Care (Signed)
  Problem: Activity: Goal: Risk for activity intolerance will decrease Outcome: Progressing   Problem: Education: Goal: Knowledge of General Education information will improve Description: Including pain rating scale, medication(s)/side effects and non-pharmacologic comfort measures Outcome: Progressing   Problem: Health Behavior/Discharge Planning: Goal: Ability to manage health-related needs will improve Outcome: Progressing   Problem: Clinical Measurements: Goal: Ability to maintain clinical measurements within normal limits will improve Outcome: Progressing   Problem: Activity: Goal: Risk for activity intolerance will decrease Outcome: Progressing   Problem: Nutrition: Goal: Adequate nutrition will be maintained Outcome: Progressing   Problem: Coping: Goal: Level of anxiety will decrease Outcome: Progressing

## 2021-03-29 NOTE — Progress Notes (Signed)
Occupational Therapy Treatment Patient Details Name: Tamara Fisher MRN: [id number] DOB: April 19, 1941 Today's Date: 03/29/2021    History of present illness 80 yo female admitted6/20/22 after a fall at  [facility], sustained dispaced  right femoral neck fracture, S/P Posterior hemiarthroplasy on 03/22/21. [id number]   OT comments  Patient awake and alert, not oriented to place, situation. Improved direction following/initiating with less cues this session for supine to sit and ADL tasks. Min cues for washing face and brushing teeth with initial min A for oral care, however would not brush hair despite max cues needing total A. Attempted sit to stand at edge of bed with walker, patient needing cues for body mechanics, attempt to widen base of support and max x2.  Attempted side step to head of bed however patient unable due to narrow base of support and report of pain. Could not point to where, when asked what hurts states "I'm not sure." Continue to recommend return to SNF as patient needing significant assistance with all self care and mobility.    Follow Up Recommendations  SNF;Supervision/Assistance - 24 hour    Equipment Recommendations  None recommended by OT       Precautions / Restrictions Precautions Precautions: Posterior Hip;Fall Precaution Booklet Issued: No Precaution Comments: patient with advanced dementia, not following precautions at bed level Restrictions Weight Bearing Restrictions: Yes RLE Weight Bearing: Weight bearing as tolerated       Mobility Bed Mobility Overal bed mobility: Needs Assistance Bed Mobility: Supine to Sit;Sit to Supine     Supine to sit: Max assist;HOB elevated Sit to supine: Max assist;+2 for physical assistance   General bed mobility comments: needs verbal cues to intiate better follow through with sitting up out of bed needing assist with LEs and trunk support. max x2 and max cues to initiate laying back into bed     Transfers Overall transfer level: Needs assistance Equipment used: Rolling walker (2 wheeled) Transfers: Sit to/from Stand Sit to Stand: Max assist;+2 physical assistance;+2 safety/equipment         General transfer comment: please see toilet transfer in ADL section    Balance Overall balance assessment: History of Falls;Needs assistance Sitting-balance support: Feet supported Sitting balance-Leahy Scale: Fair   Postural control: Posterior lean Standing balance support: Bilateral upper extremity supported;During functional activity Standing balance-Leahy Scale: Zero Standing balance comment: max x2                           ADL either performed or assessed with clinical judgement   ADL Overall ADL's : Needs assistance/impaired     Grooming: Sitting;Oral care;Wash/dry face;Brushing hair;Moderate assistance Grooming Details (indicate cue type and reason): set up assist to wash face and min cues to initiate, set up to brush teeth needing initial min A to brush then able to carry over, would not brush hair needing total A             Lower Body Dressing: Total assistance;Bed level Lower Body Dressing Details (indicate cue type and reason): to don socks, posterior hip precautions Toilet Transfer: Maximal assistance;+2 for physical assistance;+2 for safety/equipment;RW Toilet Transfer Details (indicate cue type and reason): sit to stand from edge of bed, patient with knees adducted and internally rotated and very narrow base of support despite OT attempting to aduct and externally rotate L LE. very limited standing tolerate ~10 seconds before wanting to sit back onto bed         Functional  mobility during ADLs: Maximal assistance;+2 for physical assistance;+2 for safety/equipment        Cognition Arousal/Alertness: Awake/alert Behavior During Therapy: Flat affect Overall Cognitive Status: History of cognitive impairments - at baseline                                  General Comments: history of advanced dementia, disoriented to place and could not recall after ~3 min lapse. does initiate directions with less cues this session                   Pertinent Vitals/ Pain       Pain Assessment: Faces Faces Pain Scale: Hurts little more Pain Location: states in pain yet when asked where states "I don't know" and cannot point Pain Descriptors / Indicators: Grimacing Pain Intervention(s): Limited activity within patient's tolerance         Frequency  Min 2X/week        Progress Toward Goals  OT Goals(current goals can now be found in the care plan section)  Progress towards OT goals: Progressing toward goals  Acute Rehab OT Goals Patient Stated Goal: patient unable OT Goal Formulation: Patient unable to participate in goal setting Time For Goal Achievement: 04/06/21 Potential to Achieve Goals: Fair ADL Goals Pt Will Transfer to Toilet: with mod assist;stand pivot transfer;bedside commode Additional ADL Goal #1: Patient will require mod A x1 for bed mobility in preparation for self care tasks. Additional ADL Goal #2: Patient will tolerate 10 minutes of unsupported sitting demonstrating good sitting balance in order to engage in self care tasks.  Plan Discharge plan remains appropriate       AM-PAC OT "6 Clicks" Daily Activity     Outcome Measure   Help from another person eating meals?: A Lot Help from another person taking care of personal grooming?: A Lot Help from another person toileting, which includes using toliet, bedpan, or urinal?: Total Help from another person bathing (including washing, rinsing, drying)?: A Lot Help from another person to put on and taking off regular upper body clothing?: A Lot Help from another person to put on and taking off regular lower body clothing?: Total 6 Click Score: 10    End of Session Equipment Utilized During Treatment: Gait belt;Rolling walker  OT Visit  Diagnosis: History of falling (Z91.81);Pain Pain - Right/Left: Right Pain - part of body: Hip   Activity Tolerance Patient limited by pain   Patient Left in bed;with call bell/phone within reach;with bed alarm set   Nurse Communication Mobility status        Time: 1030-1047 OT Time Calculation (min): 17 min  Charges: OT General Charges $OT Visit: 1 Visit OT Treatments $Self Care/Home Management : 8-22 mins  Marlyce Huge OT OT pager: [phone]   Carmelia Roller 03/29/2021, 12:12 PM

## 2021-03-29 NOTE — TOC Progression Note (Signed)
Transition of Care [facility]) - Progression Note    Patient Details  Name: Tamara Fisher MRN: [id number] Date of Birth: 1941/09/29  Transition of Care [facility]) CM/SW Contact  Amada [place], [place] Phone Number: 03/29/2021, 11:12 AM  Clinical Narrative:    Per Nada Maclachlan, still awaiting insurance authorization.  Have alerted pt's son.   Expected Discharge Plan: Skilled Nursing Facility Barriers to Discharge: Continued Medical Work up, English as a second language teacher  Expected Discharge Plan and Services Expected Discharge Plan: Skilled Nursing Facility In-house Referral: Clinical Social Work   Post Acute Care Choice: Skilled Nursing Facility Living arrangements for the past 2 months: Assisted Press photographer (memory care at [employer])                 DME Arranged: N/A DME Agency: NA                   Social Determinants of Health (SDOH) Interventions    Readmission Risk Interventions No flowsheet data found.

## 2021-03-30 MED ORDER — SENNA 8.6 MG PO TABS: 1.0000 | ORAL_TABLET | Freq: Two times a day (BID) | ORAL | 0 refills | Status: AC

## 2021-03-30 MED ORDER — TAMSULOSIN HCL 0.4 MG PO CAPS: 0.4000 mg | ORAL_CAPSULE | Freq: Every day | ORAL | Status: AC

## 2021-03-30 MED ORDER — ALUM & MAG HYDROXIDE-SIMETH 200-200-20 MG/5ML PO SUSP: 30.0000 mL | ORAL | 0 refills | Status: AC | PRN

## 2021-03-30 MED ORDER — ENSURE ENLIVE PO LIQD: 237.0000 mL | Freq: Two times a day (BID) | ORAL | 12 refills | Status: AC

## 2021-03-30 MED ORDER — FERROUS SULFATE 325 (65 FE) MG PO TABS: 325.0000 mg | ORAL_TABLET | Freq: Every day | ORAL | 3 refills | Status: AC

## 2021-03-30 MED ORDER — SERTRALINE HCL 100 MG PO TABS: 100.0000 mg | ORAL_TABLET | Freq: Every day | ORAL | Status: AC

## 2021-03-30 MED ORDER — DOCUSATE SODIUM 100 MG PO CAPS: 100.0000 mg | ORAL_CAPSULE | Freq: Two times a day (BID) | ORAL | 0 refills | Status: AC

## 2021-03-30 NOTE — Discharge Summary (Addendum)
Physician Discharge Summary  Tamara Fisher WUJ:[id number] DOB: Jun 03, 1941 DOA: 03/21/2021  PCP: Medicine, [facility]  Admit date: 03/21/2021 Discharge date: 04/01/21  Admitted From: Home Disposition:  SNF  Discharge Condition:Stable CODE STATUS:DNR Diet recommendation:  Dysphagia 3 diet  Brief/Interim Summary:  Patient is a 80 year old female with history of advanced dementia who presented from her ALF/memory care with witnessed mechanical fall.  She was found to have acute right humeral neck fracture and underwent total hip arthroplasty on 6/21.  She is waiting for bed at SNF.  Social worker following.  She is medically stable for discharge to skilled nursing facility as soon as bed is available.  Following problems were addressed during her hospitalization:    Acute right femoral neck fracture: Presented with mechanical fall.  Status post total hip arthroplasty on 6/21.  Continue pain management, supportive care, weightbearing as tolerated.  She needs to follow-up with orthopedics as an outpatient in 2 weeks   Subdural hematoma: Small size, seen on CT.  On admission, case was discussed with neurosurgery who advised monitoring. no further intervention.  She does not have any new neurological changes.   Right eyebrow laceration: CT face was negative for fracture.  Local wound care.  Healing well.   Hypothyroidism: Continue Synthyroid   History of advanced dementia: Continue home Depakote, Haldol, lorazepam, Zoloft.  Monitor mental status.    Speech therapy recommended dysphagia 3 diet.   History of lupus: Currently stable   History of congestive heart failure: Currently euvolemic   Postop urinary retention: Status post Foley removal on 6/26.  Now voiding.  Started on Flomax.   Debility/deconditioning: PT/OT recommended skilled nursing facility on  discharge.  She was previously living in ALF/memory care     Discharge Diagnoses:  Principal Problem:   Hip  fracture [facility]) Active Problems:   Hypothyroidism   Lupus (HCC)   Vascular dementia with behavior disturbance [facility])    Discharge Instructions  Discharge Instructions     Diet general   Complete by: As directed    Dysphagia 3 diet   Discharge instructions   Complete by: As directed    1)Please take prescribed medications as instructed. 2)Follow up with orthopedics in 2 weeks.  Name and number the provider has been attached.   Increase activity slowly   Complete by: As directed    No wound care   Complete by: As directed       Allergies as of 04/01/2021       Reactions   Tetracyclines & Related Nausea And Vomiting   Sulfa Antibiotics Rash        Medication List     TAKE these medications    acetaminophen 500 MG tablet Commonly known as: TYLENOL Take 500 mg by mouth 2 (two) times daily.   acetaminophen 325 MG tablet Commonly known as: TYLENOL Take 650 mg by mouth every 6 (six) hours as needed for moderate pain. Not to exceed total dose more than 3000 mg. Notes to patient: Last dose given 06/27 4:43pm   alum & mag hydroxide-simeth 200-200-20 MG/5ML suspension Commonly known as: MAALOX/MYLANTA Take 30 mLs by mouth every 4 (four) hours as needed for indigestion.   divalproex 125 MG DR tablet Commonly known as: DEPAKOTE Take 250 mg by mouth 3 (three) times daily.   docusate sodium 100 MG capsule Commonly known as: COLACE Take 1 capsule (100 mg total) by mouth 2 (two) times daily.   enoxaparin 40 MG/0.4ML injection Commonly known as: LOVENOX Inject  0.4 mLs (40 mg total) into the skin daily for 30 doses. For 30 days post op for DVT prophylaxis   feeding supplement Liqd Take 237 mLs by mouth 2 (two) times daily between meals.   ferrous sulfate 325 (65 FE) MG tablet Take 1 tablet (325 mg total) by mouth daily with breakfast.   haloperidol 2 MG tablet Commonly known as: HALDOL Take 2 mg by mouth 2 (two) times daily as needed for agitation.   haloperidol 1  MG tablet Commonly known as: HALDOL Take 1 mg by mouth 3 (three) times daily.   levothyroxine 75 MCG tablet Commonly known as: SYNTHROID Take 1 tablet (75 mcg total) by mouth daily before breakfast. Notes to patient: Resume home regimen   loratadine 10 MG tablet Commonly known as: CLARITIN Take 10 mg by mouth daily. Notes to patient: Resume home regimen   LORazepam 0.5 MG tablet Commonly known as: ATIVAN Take 1 tablet (0.5 mg total) by mouth 2 (two) times daily. What changed: when to take this   Melatonin 3 MG Tbdp Take 6 mg by mouth at bedtime. Notes to patient: Resume home regimen   nystatin powder Commonly known as: MYCOSTATIN/NYSTOP Apply 1 application topically 2 (two) times daily. Under breast and between legs. Notes to patient: Resume home regimen   OVER THE COUNTER MEDICATION Apply 1 application topically every 2 (two) hours as needed (sun protection). Sunscreen SPF 30+   senna 8.6 MG Tabs tablet Commonly known as: SENOKOT Take 1 tablet (8.6 mg total) by mouth 2 (two) times daily.   sertraline 100 MG tablet Commonly known as: ZOLOFT Take 1 tablet (100 mg total) by mouth daily. What changed: how much to take   tamsulosin 0.4 MG Caps capsule Commonly known as: FLOMAX Take 1 capsule (0.4 mg total) by mouth daily after supper.   traMADol 50 MG tablet Commonly known as: Ultram Take 1 tablet (50 mg total) by mouth every 6 (six) hours as needed for severe pain. Notes to patient: Last dose given 06/29 06:27am   zinc sulfate 220 (50 Zn) MG capsule Take 220 mg by mouth daily. Notes to patient: Resume home regimen        Contact information for follow-up providers     Teryl Lucy, MD. Schedule an appointment as soon as possible for a visit in 2 week(s).   Specialty: Orthopedic Surgery Contact information: [address] [place] [address] [phone]              Contact information for after-discharge care     Destination      [facility] .   Service: Skilled Nursing Contact information: [address] [place] [address] [phone]                    Allergies  Allergen Reactions   Tetracyclines & Related Nausea And Vomiting   Sulfa Antibiotics Rash    Consultations: Orthopedics   Procedures/Studies: DG Pelvis 1-2 Views  Result Date: 03/21/2021 CLINICAL DATA:  Acute RIGHT hip pain following fall. Initial encounter. EXAM: PELVIS - 1-2 VIEW COMPARISON:  None. FINDINGS: A RIGHT femoral neck fracture is noted with approximately 1.5 cm SUPERIOR displacement. There is no evidence of dislocation. No other fractures are identified. IMPRESSION: RIGHT femoral neck fracture with approximately 1.5 cm SUPERIOR displacement. Electronically Signed   By: Harmon Pier M.D.   On: 03/21/2021 10:10   CT Head Wo Contrast  Result Date: 03/21/2021 CLINICAL  DATA:  80 year old female status post fall. Right forehead laceration. EXAM: CT HEAD WITHOUT CONTRAST TECHNIQUE: Contiguous axial images were obtained from the base of the skull through the vertex without intravenous contrast. COMPARISON:  Head CT 09/12/2018. Face and cervical spine CT today reported separately. FINDINGS: Brain: No midline shift, mass effect, or evidence of intracranial mass lesion. No ventriculomegaly. Confluent bilateral deep white matter hypodensity appears stable since 2019 compatible with chronic small vessel disease. Chronic thickening of the falx is noted, but at the level of the pericallosal sulcus thickness and density appear increased compared to 2019 suspicious for trace para falcine subdural blood (series 6, image 49). But no other intracranial hemorrhage is identified. No cortically based acute infarct identified. No suspicious intracranial vascular hyperdensity. Vascular: Calcified atherosclerosis at the skull base. Skull: No fracture identified.  Stable hyperostosis frontalis. Sinuses/Orbits:  Visualized paranasal sinuses and mastoids are stable and well aerated. Other: Mild right forehead scalp hematoma or contusion on series 4, image 29. No scalp soft tissue gas. Visualized orbit soft tissues are within normal limits. IMPRESSION: 1. Appearance suspicious for trace parafalcine subdural blood. 2. No intracranial mass effect or other acute traumatic injury to the brain identified. Chronic small vessel disease. 3. Mild right forehead scalp hematoma or contusion without underlying skull fracture. Electronically Signed   By: Odessa Fleming M.D.   On: 03/21/2021 10:59   CT Cervical Spine Wo Contrast  Result Date: 03/21/2021 CLINICAL DATA:  80 year old female status post fall. Right forehead laceration. EXAM: CT CERVICAL SPINE WITHOUT CONTRAST TECHNIQUE: Multidetector CT imaging of the cervical spine was performed without intravenous contrast. Multiplanar CT image reconstructions were also generated. COMPARISON:  Cervical spine CT 11/20/2018. FINDINGS: Alignment: Preserved cervical lordosis. Cervicothoracic junction alignment is within normal limits. Bilateral posterior element alignment is within normal limits. Skull base and vertebrae: Osteopenia. Visualized skull base is intact. No atlanto-occipital dissociation. C1 and C2 appear intact and normally aligned. No acute osseous abnormality identified. Soft tissues and spinal canal: No prevertebral fluid or swelling. No visible canal hematoma. Chronic severe right carotid bifurcation calcified plaque. Bulky left side cervical carotid calcified plaque also. Disc levels: Mild for age cervical spine degeneration. Chronic disc and endplate degeneration maximal at C6-C7. Upper chest: Stable, negative. IMPRESSION: No acute traumatic injury identified in the cervical spine. Mild for age cervical spine degeneration. Electronically Signed   By: Odessa Fleming M.D.   On: 03/21/2021 11:06   DG Pelvis Portable  Result Date: 03/22/2021 CLINICAL DATA:  Hip replacement EXAM:  PORTABLE PELVIS 1-2 VIEWS COMPARISON:  03/21/2021 FINDINGS: Interval right hip hemiarthroplasty with intact hardware and normal alignment. Gas in the soft tissues consistent with recent surgery. IMPRESSION: Interval right hip replacement with expected postsurgical changes Electronically Signed   By: Jasmine Pang M.D.   On: 03/22/2021 17:48   DG HIP PORT UNILAT WITH PELVIS 1V RIGHT  Result Date: 03/22/2021 CLINICAL DATA:  Hip replacement EXAM: DG HIP (WITH OR WITHOUT PELVIS) 1V PORT RIGHT COMPARISON:  03/21/2021 FINDINGS: Pubic symphysis and rami are intact. Interval right hip hemiarthroplasty with intact hardware and normal alignment. Gas in the soft tissues consistent with recent surgery IMPRESSION: Right hip replacement with expected postsurgical change Electronically Signed   By: Jasmine Pang M.D.   On: 03/22/2021 17:47   CT Maxillofacial WO CM  Result Date: 03/21/2021 CLINICAL DATA:  80 year old female status post fall. Right forehead laceration. EXAM: CT MAXILLOFACIAL WITHOUT CONTRAST TECHNIQUE: Multidetector CT imaging of the maxillofacial structures was performed. Multiplanar CT image reconstructions  were also generated. COMPARISON:  Head CT today reported separately. FINDINGS: Osseous: Motion artifact through the mandible on the initial images, repeated on series 5. Mandible appears intact and normally located. No maxilla, zygoma, or pterygoid fracture. Central skull base appears intact with osteopenia. No nasal bone fracture identified. Orbits: No orbital wall fracture. Mild right lateral periorbital soft tissue hematoma or contusion on series eight image 20. Globes and intra orbital soft tissues remain normal. Sinuses: Clear, only trace sinus mucosal thickening. Tympanic cavities and mastoids are clear. Soft tissues: Bulky calcified carotid atherosclerosis in the neck especially on the right side. Otherwise negative visible noncontrast deep soft tissue spaces of the face and neck. Limited  intracranial: Reported separately today. IMPRESSION: 1. Mild right lateral periorbital soft tissue hematoma or contusion. 2. No facial fracture or other acute traumatic injury identified. 3. Severe calcified atherosclerosis of the right carotid bifurcation. Electronically Signed   By: Odessa FlemingH  Hall M.D.   On: 03/21/2021 11:03      Subjective:  Patient seen and examined the bedside this morning.  Hemodynamically stable for discharge today  Discharge Exam: Vitals:   03/31/21 2119 04/01/21 0549  BP: 125/74 107/71  Pulse: 93 79  Resp: 16 18  Temp: 97.6 F (36.4 C) 98.1 F (36.7 C)  SpO2: 95% 98%   Vitals:   03/31/21 0545 03/31/21 1406 03/31/21 2119 04/01/21 0549  BP: 119/80 124/83 125/74 107/71  Pulse: 88 88 93 79  Resp: 16 16 16 18   Temp: 98.4 F (36.9 C) 98.3 F (36.8 C) 97.6 F (36.4 C) 98.1 F (36.7 C)  TempSrc: Oral Oral Oral Oral  SpO2: 97% 96% 95% 98%  Weight:      Height:        General: Pt is alert, awake, not in acute distress Cardiovascular: RRR, S1/S2 +, no rubs, no gallops Respiratory: CTA bilaterally, no wheezing, no rhonchi Abdominal: Soft, NT, ND, bowel sounds + Extremities: no edema, no cyanosis    The results of significant diagnostics from this hospitalization (including imaging, microbiology, ancillary and laboratory) are listed below for reference.     Microbiology: Recent Results (from the past 240 hour(s))  SARS CORONAVIRUS 2 (TAT 6-24 HRS) Nasopharyngeal Nasopharyngeal Swab     Status: None   Collection Time: 03/27/21  3:01 PM   Specimen: Nasopharyngeal Swab  Result Value Ref Range Status   SARS Coronavirus 2 NEGATIVE NEGATIVE Final    Comment: (NOTE) SARS-CoV-2 target nucleic acids are NOT DETECTED.  The SARS-CoV-2 RNA is generally detectable in upper and lower respiratory specimens during the acute phase of infection. Negative results do not preclude SARS-CoV-2 infection, do not rule out co-infections with other pathogens, and should not be  used as the sole basis for treatment or other patient management decisions. Negative results must be combined with clinical observations, patient history, and epidemiological information. The expected result is Negative.  Fact Sheet for Patients: HairSlick.nohttps://www.fda.gov/media/138098/download  Fact Sheet for Healthcare Providers: quierodirigir.comhttps://www.fda.gov/media/138095/download  This test is not yet approved or cleared by the [place] FDA and  has been authorized for detection and/or diagnosis of SARS-CoV-2 by FDA under an Emergency Use Authorization (EUA). This EUA will remain  in effect (meaning this test can be used) for the duration of the COVID-19 declaration under Se ction 564(b)(1) of the Act, 21 U.S.C. section 360bbb-3(b)(1), unless the authorization is terminated or revoked sooner.  Performed at [facility], [address] [place], [place] [address]      Labs: BNP (last  3 results) No results for input(s): BNP in the last 8760 hours. Basic Metabolic Panel: Recent Labs  Lab 03/29/21 0249 04/01/21 0901  NA  --  137  K  --  4.0  CL  --  103  CO2  --  27  GLUCOSE  --  100*  BUN  --  15  CREATININE 0.63 0.67  CALCIUM  --  8.8*   Liver Function Tests: No results for input(s): AST, ALT, ALKPHOS, BILITOT, PROT, ALBUMIN in the last 168 hours. No results for input(s): LIPASE, AMYLASE in the last 168 hours. No results for input(s): AMMONIA in the last 168 hours. CBC: Recent Labs  Lab 04/01/21 0901  WBC 6.2  NEUTROABS 3.8  HGB 10.9*  HCT 33.8*  MCV 95.5  PLT 272   Cardiac Enzymes: No results for input(s): CKTOTAL, CKMB, CKMBINDEX, TROPONINI in the last 168 hours. BNP: Invalid input(s): POCBNP CBG: No results for input(s): GLUCAP in the last 168 hours. D-Dimer No results for input(s): DDIMER in the last 72 hours. Hgb A1c No results for input(s): HGBA1C in the last 72 hours. Lipid Profile No results for input(s): CHOL, HDL, LDLCALC, TRIG, CHOLHDL,  LDLDIRECT in the last 72 hours. Thyroid function studies No results for input(s): TSH, T4TOTAL, T3FREE, THYROIDAB in the last 72 hours.  Invalid input(s): FREET3 Anemia work up No results for input(s): VITAMINB12, FOLATE, FERRITIN, TIBC, IRON, RETICCTPCT in the last 72 hours. Urinalysis    Component Value Date/Time   COLORURINE YELLOW 10/17/2018 0755   APPEARANCEUR HAZY (A) 10/17/2018 0755   LABSPEC 1.024 10/17/2018 0755   PHURINE 5.0 10/17/2018 0755   GLUCOSEU NEGATIVE 10/17/2018 0755   HGBUR NEGATIVE 10/17/2018 0755   BILIRUBINUR NEGATIVE 10/17/2018 0755   KETONESUR NEGATIVE 10/17/2018 0755   PROTEINUR NEGATIVE 10/17/2018 0755   NITRITE NEGATIVE 10/17/2018 0755   LEUKOCYTESUR LARGE (A) 10/17/2018 0755   Sepsis Labs Invalid input(s): PROCALCITONIN,  WBC,  LACTICIDVEN Microbiology Recent Results (from the past 240 hour(s))  SARS CORONAVIRUS 2 (TAT 6-24 HRS) Nasopharyngeal Nasopharyngeal Swab     Status: None   Collection Time: 03/27/21  3:01 PM   Specimen: Nasopharyngeal Swab  Result Value Ref Range Status   SARS Coronavirus 2 NEGATIVE NEGATIVE Final    Comment: (NOTE) SARS-CoV-2 target nucleic acids are NOT DETECTED.  The SARS-CoV-2 RNA is generally detectable in upper and lower respiratory specimens during the acute phase of infection. Negative results do not preclude SARS-CoV-2 infection, do not rule out co-infections with other pathogens, and should not be used as the sole basis for treatment or other patient management decisions. Negative results must be combined with clinical observations, patient history, and epidemiological information. The expected result is Negative.  Fact Sheet for Patients: HairSlick.no  Fact Sheet for Healthcare Providers: quierodirigir.com  This test is not yet approved or cleared by the [place] FDA and  has been authorized for detection and/or diagnosis of SARS-CoV-2 by FDA  under an Emergency Use Authorization (EUA). This EUA will remain  in effect (meaning this test can be used) for the duration of the COVID-19 declaration under Se ction 564(b)(1) of the Act, 21 U.S.C. section 360bbb-3(b)(1), unless the authorization is terminated or revoked sooner.  Performed at [facility], [address], [place], [place] [address]     Please note: You were cared for by a hospitalist during your hospital stay. Once you are discharged, your primary care physician will handle any further medical issues. Please note that NO REFILLS for  any discharge medications will be authorized once you are discharged, as it is imperative that you return to your primary care physician (or establish a relationship with a primary care physician if you do not have one) for your post hospital discharge needs so that they can reassess your need for medications and monitor your lab values.    Time coordinating discharge: 40 minutes  SIGNED:   Burnadette Pop, MD  [facility] 04/01/2021, 12:40 PM Pager [phone]  If 7PM-7AM, please contact night-coverage [email or web address] Password [email or web address]

## 2021-03-30 NOTE — TOC Progression Note (Signed)
Transition of Care [facility]) - Progression Note    Patient Details  Name: Tamara Fisher MRN: [id number] Date of Birth: [birth year]  [facility] CM/SW Contact  Tamara Fisher, [place] Phone Number: 03/30/2021, 2:26 PM  Clinical Narrative:    Alerted by Tamara Fisher SNF that [facility] has denied SNF authorization and offered peer-to-peer review.  Have provided attending MD with phone # [phone]) and he is aware call must be made by noon tomorrow.  Await answer following this call.  Have informed pt's son, Tamara Fisher, of this issue.   Expected Discharge Plan: Skilled Nursing Facility Barriers to Discharge: Continued Medical Work up, English as a second language teacher  Expected Discharge Plan and Services Expected Discharge Plan: Skilled Nursing Facility In-house Referral: Clinical Social Work   Post Acute Care Choice: Skilled Nursing Facility Living arrangements for the past 2 months: Assisted Press photographer (memory care at [employer]) Expected Discharge Date: 03/30/21               DME Arranged: N/A DME Agency: NA                   Social Determinants of Health (SDOH) Interventions    Readmission Risk Interventions No flowsheet data found.

## 2021-03-30 NOTE — Plan of Care (Signed)
  Problem: Coping: Goal: Level of anxiety will decrease Outcome: Progressing   Problem: Pain Managment: Goal: General experience of comfort will improve Outcome: Progressing   Problem: Safety: Goal: Ability to remain free from injury will improve Outcome: Progressing   

## 2021-03-30 NOTE — Plan of Care (Signed)
  Problem: Activity: Goal: Risk for activity intolerance will decrease Outcome: Progressing   Problem: Nutrition: Goal: Adequate nutrition will be maintained Outcome: Progressing   Problem: Elimination: Goal: Will not experience complications related to urinary retention Outcome: Progressing   Problem: Education: Goal: Knowledge of General Education information will improve Description: Including pain rating scale, medication(s)/side effects and non-pharmacologic comfort measures Outcome: Progressing   Problem: Health Behavior/Discharge Planning: Goal: Ability to manage health-related needs will improve Outcome: Progressing   Problem: Clinical Measurements: Goal: Ability to maintain clinical measurements within normal limits will improve Outcome: Progressing   Problem: Activity: Goal: Risk for activity intolerance will decrease Outcome: Progressing   Problem: Nutrition: Goal: Adequate nutrition will be maintained Outcome: Progressing   Problem: Coping: Goal: Level of anxiety will decrease Outcome: Progressing   Problem: Elimination: Goal: Will not experience complications related to bowel motility Outcome: Progressing   Problem: Pain Managment: Goal: General experience of comfort will improve Outcome: Progressing   Problem: Safety: Goal: Ability to remain free from injury will improve Outcome: Progressing   Problem: Skin Integrity: Goal: Risk for impaired skin integrity will decrease Outcome: Progressing

## 2021-03-31 NOTE — Progress Notes (Signed)
Occupational Therapy Treatment Patient Details Name: Tamara Fisher MRN: [id number] DOB: 08-06-1941 Today's Date: 03/31/2021    History of present illness 80 yo female admitted6/20/22 after a fall at  [facility], sustained dispaced  right femoral neck fracture, S/P Posterior hemiarthroplasy on 03/22/21. [id number]   OT comments  Treatment focused on improving functional mobility and participation in ADLs in order to reduce caregiver burden. Today patient min assist to transfer to side of bed needing assistance to scoot to edge and tactile cues to keep patient from breaking hip precaution. Patient min assist to stand with RW from elevated bed. Patient able to ambulate to bathroom slowly with altered gait and leading with LLE instead of right. Min assist for walker management and multimodal cues to turn and sit on commode over toilet. Patient unable to actually void but assisted with attempting to wipe and manage clothing in seated position. Patient mod assist to rise from commode and ambulate approx 5 feet to repositioned recliner. She fatigued quickly after ambulation to bathroom. Patient tending to have narrow base of support but using walker somewhat efficiently. Patient has met initial OT goals. Will update goals to advance patient's functional abilities. Continue to recommend short term rehab to improve patient's functional abilities in order to reduce caregiver burden and improve independence.    Follow Up Recommendations  SNF    Equipment Recommendations  Other (comment) (Defer to Next Venue)    Recommendations for Other Services      Precautions / Restrictions Precautions Precautions: Posterior Hip;Fall Precaution Booklet Issued: No Precaution Comments: Hx of demetia Restrictions Weight Bearing Restrictions: No RLE Weight Bearing: Weight bearing as tolerated       Mobility Bed Mobility Overal bed mobility: Needs Assistance       Supine to sit: Min assist;HOB  elevated     General bed mobility comments: Patient able to transfer slowly to side of bed using bed rails, needed tactile cue to not break posterior hip precautions. Min assist for scooting to the edge of the bed.    Transfers Overall transfer level: Needs assistance Equipment used: Rolling walker (2 wheeled) Transfers: Sit to/from Stand Sit to Stand: Mod assist;From elevated surface         General transfer comment: Mod assist to stand with multimodal cues with RW from slightly elevated bed height. Patient able to ambulate slowly with impaired gait to bathroom, approx 15 feet, with min assist from therapist. Patient leading with LLE but managing well fairly well - with min assist for the turn to sit on commode. Stopped twice during ambulation but continued with encouragement.    Balance Overall balance assessment: Needs assistance Sitting-balance support: No upper extremity supported Sitting balance-Leahy Scale: Fair     Standing balance support: Bilateral upper extremity supported;During functional activity Standing balance-Leahy Scale: Poor                             ADL either performed or assessed with clinical judgement   ADL Overall ADL's : Needs assistance/impaired                         Toilet Transfer: Minimal assistance Toilet Transfer Details (indicate cue type and reason): Patient able to slowly ambulate to bathroom, approx 15 feet, with RW. Patient stopped twice stating "I can't do this" but with encouragement able to continue. Increasd time to make turn and back up to oilet - needing tactile  cues for hand placement and walker management to sit on toilet. Toileting- Clothing Manipulation and Hygiene: Maximal assistance Toileting - Clothing Manipulation Details (indicate cue type and reason): Max assist for toileting - patient didn't actually void but atempted to wipe and manage clothing in seated position. Therapist assisted with donning  soiled brief and donning clean one.             Vision Patient Visual Report: No change from baseline Vision Assessment?: No apparent visual deficits   Perception     Praxis      Cognition Arousal/Alertness: Awake/alert Behavior During Therapy: Flat affect Overall Cognitive Status: History of cognitive impairments - at baseline                                 General Comments: Hx of advanced dementia. ABle to follow commands with verbal and tacile cues. Unable to recall or retain hip precautions.        Exercises     Shoulder Instructions       General Comments      Pertinent Vitals/ Pain       Pain Assessment: Faces Faces Pain Scale: Hurts a little bit Pain Location: R hip Pain Descriptors / Indicators: Grimacing;Sore Pain Intervention(s): Monitored during session  Home Living                                          Prior Functioning/Environment              Frequency  Min 2X/week        Progress Toward Goals  OT Goals(current goals can now be found in the care plan section)  Progress towards OT goals: Progressing toward goals  Acute Rehab OT Goals OT Goal Formulation: Patient unable to participate in goal setting Time For Goal Achievement: 04/06/21 Potential to Achieve Goals: Good  Plan Discharge plan remains appropriate    Co-evaluation          OT goals addressed during session: ADL's and self-care (functional mobility)      AM-PAC OT "6 Clicks" Daily Activity     Outcome Measure   Help from another person eating meals?: A Little Help from another person taking care of personal grooming?: A Little Help from another person toileting, which includes using toliet, bedpan, or urinal?: A Lot Help from another person bathing (including washing, rinsing, drying)?: A Lot Help from another person to put on and taking off regular upper body clothing?: A Lot Help from another person to put on and taking off  regular lower body clothing?: A Lot 6 Click Score: 14    End of Session Equipment Utilized During Treatment: Gait belt;Rolling walker  OT Visit Diagnosis: History of falling (Z91.81);Pain Pain - Right/Left: Right Pain - part of body: Hip   Activity Tolerance Patient limited by pain   Patient Left in chair;with call bell/phone within reach;with chair alarm set;Other (comment)   Nurse Communication Mobility status        Time: [phone] OT Time Calculation (min): 23 min  Charges: OT General Charges $OT Visit: 1 Visit OT Treatments $Self Care/Home Management : 8-22 mins $Therapeutic Activity: 8-22 mins  Khiem Gargis, OTR/L [place]  Office [phone] Pager: [place] 03/31/2021, 9:22 AM

## 2021-03-31 NOTE — Plan of Care (Signed)
  Problem: Nutrition: Goal: Adequate nutrition will be maintained Outcome: Progressing   Problem: Coping: Goal: Level of anxiety will decrease Outcome: Progressing   Problem: Pain Managment: Goal: General experience of comfort will improve Outcome: Progressing   Problem: Safety: Goal: Ability to remain free from injury will improve Outcome: Progressing   

## 2021-03-31 NOTE — Progress Notes (Signed)
Patient voided x1 today. Bladder scanned . Notified MD. Ordered I&O cath. I&O cath done with noted.

## 2021-03-31 NOTE — Care Management Important Message (Signed)
Important Message  Patient Details IM Letter placed in Patient's room. Name: Tamara Fisher MRN: [id number] Date of Birth: 1940/11/23   Medicare Important Message Given:  Yes     Caren Macadam 03/31/2021, 10:59 AM

## 2021-03-31 NOTE — Progress Notes (Signed)
Physical Therapy Treatment Patient Details Name: Tamara Fisher MRN: [id number] DOB: Dec 30, 1940 Today's Date: 03/31/2021    History of Present Illness 80 yo female admitted6/20/22 after a fall at  [facility], sustained dispaced  right femoral neck fracture, S/P Posterior hemiarthroplasy on 03/22/21. [id number]    PT Comments    POD # 9 am Assisted OOB to [facility] then amb was difficult and required + 2 assist. General bed mobility comments: increased time and repeat simple functional VC's assisted pt to EOB with caution to avoid hip flex >90 degrees. General transfer comment: Pt required + 2 side by side assist to transfer from EOB to [facility] with hand over hand cueing and tactile assist to complete 1/4 turn and target BSC correctly.  Then + 2 side by side assist to stand from [facility] to amb with simple repeat VC's and hand over hand cueing for proper hand placement onto walker. General Gait Details: pt required Max Assist to advance and guide walker as well as advance steps with tactile cueing to "push" pt forward to initiate steps.  Poor standing balance.  narrow BOS. Pt is from an ALF and was amb.  Pt will need ST Rehab at SNF to address her mobility level.   Follow Up Recommendations  SNF     Equipment Recommendations  None recommended by PT    Recommendations for Other Services       Precautions / Restrictions Precautions Precautions: Posterior Hip;Fall Precaution Comments: Hx of demetia Restrictions Weight Bearing Restrictions: No RLE Weight Bearing: Weight bearing as tolerated    Mobility  Bed Mobility Overal bed mobility: Needs Assistance Bed Mobility: Supine to Sit     Supine to sit: Mod assist     General bed mobility comments: increased time and repeat simple functional VC's assisted pt to EOB with caution to avoid hip flex >90 degrees.    Transfers Overall transfer level: Needs assistance Equipment used: Rolling walker (2 wheeled);None Transfers: Sit to/from  [employer] Sit to Stand: Mod assist;From elevated surface;Min assist Stand pivot transfers: Mod assist;Max assist       General transfer comment: Pt required + 2 side by side assist to transfer from EOB to [facility] with hand over hand cueing and tactile assist to complete 1/4 turn and target BSC correctly.  Then + 2 side by side assist to stand from [facility] to amb with simple repeat VC's and hand over hand cueing for proper hand placement onto walker.  Ambulation/Gait Ambulation/Gait assistance: Max assist;+2 safety/equipment Gait Distance (Feet): 11 Feet Assistive device: Rolling walker (2 wheeled) Gait Pattern/deviations: Step-to pattern;Decreased stride length;Decreased weight shift to right;Trunk flexed;Narrow base of support Gait velocity: decreased   General Gait Details: pt required Max Assist to advance and guide walker as well as advance steps with tactile cueing to "push" pt forward to initiate steps.  Poor standing balance.  narrow BOS.   Stairs             Wheelchair Mobility    Modified Rankin (Stroke Patients Only)       Balance Overall balance assessment: Needs assistance Sitting-balance support: No upper extremity supported Sitting balance-Leahy Scale: Fair     Standing balance support: Bilateral upper extremity supported;During functional activity Standing balance-Leahy Scale: Poor                              Cognition Arousal/Alertness: Awake/alert Behavior During Therapy: Flat affect Overall Cognitive Status: History of  cognitive impairments - at baseline                                 General Comments: Hx of advanced dementia. ABle to follow commands with verbal and tacile cues. Unable to recall or retain hip precautions.  Pleasant      Exercises      General Comments        Pertinent Vitals/Pain Pain Assessment: Faces Faces Pain Scale: No hurt Pain Location: R hip Pain Descriptors / Indicators:  Grimacing;Sore Pain Intervention(s): Monitored during session;Repositioned    Home Living                      Prior Function            PT Goals (current goals can now be found in the care plan section) Progress towards PT goals: Progressing toward goals    Frequency    Min 2X/week      PT Plan Current plan remains appropriate    Co-evaluation              AM-PAC PT "6 Clicks" Mobility   Outcome Measure  Help needed turning from your back to your side while in a flat bed without using bedrails?: A Lot Help needed moving from lying on your back to sitting on the side of a flat bed without using bedrails?: A Lot Help needed moving to and from a bed to a chair (including a wheelchair)?: A Lot Help needed standing up from a chair using your arms (e.g., wheelchair or bedside chair)?: A Lot Help needed to walk in hospital room?: A Lot Help needed climbing 3-5 steps with a railing? : Total 6 Click Score: 11    End of Session Equipment Utilized During Treatment: Gait belt Activity Tolerance: Patient tolerated treatment well Patient left: in chair;with call bell/phone within reach;with chair alarm set Nurse Communication: Mobility status PT Visit Diagnosis: Unsteadiness on feet (R26.81);Muscle weakness (generalized) (M62.81)     Time: 2683-4196 PT Time Calculation (min) (ACUTE ONLY): 18 min  Charges:  $Gait Training: 8-22 mins                     {Emelia Sandoval  PTA Acute  Rehabilitation [employer]      [phone] Office      [phone]

## 2021-03-31 NOTE — Plan of Care (Signed)
?  Problem: Pain Managment: ?Goal: General experience of comfort will improve ?Outcome: Progressing ?  ?Problem: Pain Management: ?Goal: Pain level will decrease with appropriate interventions ?Outcome: Progressing ?  ?

## 2021-03-31 NOTE — Progress Notes (Addendum)
Patient seen and examined at the bedside this morning.  Hemodynamically stable.  No new changes in the medical management.  Discharge summary and orders were placed on 03/30/2021 for skilled nursing facility discharge.   Her insurance demanded peer-to-peer review.  I called the number provided by the  social worker and I am waiting for the call back.  I have not heard anything from the physician from insurance company for peer to peer yet.

## 2021-04-01 LAB — BASIC METABOLIC PANEL
Anion gap: 7 (ref 5–15)
BUN: 15 mg/dL (ref 8–23)
CO2: 27 mmol/L (ref 22–32)
Calcium: 8.8 mg/dL — ABNORMAL LOW (ref 8.9–10.3)
Chloride: 103 mmol/L (ref 98–111)
Creatinine, Ser: 0.67 mg/dL (ref 0.44–1.00)
GFR, Estimated: >60 mL/min (ref >60)
Glucose, Bld: 100 mg/dL — ABNORMAL HIGH (ref 70–99)
Potassium: 4 mmol/L (ref 3.5–5.1)
Sodium: 137 mmol/L (ref 135–145)

## 2021-04-01 LAB — RESP PANEL BY RT-PCR (FLU A&B, COVID) ARPGX2
Influenza A by PCR: NEGATIVE
Influenza B by PCR: NEGATIVE
SARS Coronavirus 2 by RT PCR: NEGATIVE

## 2021-04-01 LAB — CBC WITH DIFFERENTIAL/PLATELET
Abs Immature Granulocytes: 0.09 10*3/uL — ABNORMAL HIGH (ref 0.00–0.07)
Basophils Absolute: 0 10*3/uL (ref 0.0–0.1)
Basophils Relative: 0 %
Eosinophils Absolute: 0.1 10*3/uL (ref 0.0–0.5)
Eosinophils Relative: 1 %
HCT: 33.8 % — ABNORMAL LOW (ref 36.0–46.0)
Hemoglobin: 10.9 g/dL — ABNORMAL LOW (ref 12.0–15.0)
Immature Granulocytes: 1 %
Lymphocytes Relative: 24 %
Lymphs Abs: 1.5 10*3/uL (ref 0.7–4.0)
MCH: 30.8 pg (ref 26.0–34.0)
MCHC: 32.2 g/dL (ref 30.0–36.0)
MCV: 95.5 fL (ref 80.0–100.0)
Monocytes Absolute: 0.7 10*3/uL (ref 0.1–1.0)
Monocytes Relative: 12 %
Neutro Abs: 3.8 10*3/uL (ref 1.7–7.7)
Neutrophils Relative %: 62 %
Platelets: 272 10*3/uL (ref 150–400)
RBC: 3.54 MIL/uL — ABNORMAL LOW (ref 3.87–5.11)
RDW: 14 % (ref 11.5–15.5)
WBC: 6.2 10*3/uL (ref 4.0–10.5)
nRBC: 0 % (ref 0.0–0.2)

## 2021-04-01 NOTE — Plan of Care (Signed)
  Problem: Activity: Goal: Risk for activity intolerance will decrease Outcome: Progressing   Problem: Nutrition: Goal: Adequate nutrition will be maintained Outcome: Progressing   Problem: Elimination: Goal: Will not experience complications related to urinary retention Outcome: Progressing   Problem: Education: Goal: Knowledge of General Education information will improve Description: Including pain rating scale, medication(s)/side effects and non-pharmacologic comfort measures Outcome: Progressing   Problem: Clinical Measurements: Goal: Ability to maintain clinical measurements within normal limits will improve Outcome: Progressing   Problem: Activity: Goal: Risk for activity intolerance will decrease Outcome: Progressing   Problem: Nutrition: Goal: Adequate nutrition will be maintained Outcome: Progressing   Problem: Elimination: Goal: Will not experience complications related to bowel motility Outcome: Progressing   Problem: Pain Managment: Goal: General experience of comfort will improve Outcome: Progressing   Problem: Safety: Goal: Ability to remain free from injury will improve Outcome: Progressing   Problem: Skin Integrity: Goal: Risk for impaired skin integrity will decrease Outcome: Progressing

## 2021-04-01 NOTE — Progress Notes (Signed)
Patient seen and examined at the bedside this morning.  Hemodynamically stable.  Comfortable.  No new change in the medical management.  She was retaining urine yesterday but she has been voiding well.  Lower abdomen is not full or distended , not tender.  She is medically stable for discharge to skilled nursing facilty as soon as possible.

## 2021-04-01 NOTE — Progress Notes (Signed)
Patient transported to [facility] via [place] at this time. All HS meds given and tylenol. Patient Alert calm and disoriented.  Transferred to stretcher with no expression of pain.  Discharged at this time from [facility].

## 2021-04-01 NOTE — Progress Notes (Addendum)
Attempted to give report to facility x 2, transferred to voicemail, left callback number.  Report given to Tamara Fisher of [place], all questions answered. Pt resting comfortably in bed this time.  Pt to discharge to SNF via [facility].

## 2021-04-01 NOTE — TOC Transition Note (Signed)
Transition of Care [facility]) - CM/SW Discharge Note   Patient Details  Name: Tamara Fisher MRN: [id number] Date of Birth: 1941-06-08  Transition of [facility]) CM/SW Contact:  Amada Jupiter, LCSW Phone Number: 04/01/2021, 3:04 PM   Clinical Narrative:    Have received insurance authorization following peer-to-peer call.  [place] able to admit pt today and son aware/ agreeable.  [facility] called at 2:30 pm.  RN to call report to [phone].  No further TOC needs.   Final next level of care: Skilled Nursing Facility Barriers to Discharge: Barriers Resolved   Patient Goals and CMS Choice Patient states their goals for this hospitalization and ongoing recovery are:: unable to verbalize   Choice offered to / list presented to : Adult Children  Discharge Placement              Patient chooses bed at: [facility] (now [place]) Patient to be transferred to facility by: [facility] Name of family member notified: son, Barbara Cower Patient and family notified of of transfer: 04/01/21  Discharge Plan and Services In-house Referral: Clinical Social Work   Post Acute Care Choice: Skilled Nursing Facility          DME Arranged: N/A DME Agency: NA                  Social Determinants of Health (SDOH) Interventions     Readmission Risk Interventions No flowsheet data found.

## 2021-06-02 DEATH — deceased
